# Patient Record
Sex: Female | Born: 1975 | Race: Black or African American | Hispanic: No | Marital: Married | Smoking: Former smoker
Health system: Southern US, Community
[De-identification: ages and names within clinical notes are randomized; demographics above are authoritative.]

## PROBLEM LIST (undated history)

## (undated) DIAGNOSIS — G479 Sleep disorder, unspecified: Secondary | ICD-10-CM

## (undated) DIAGNOSIS — K219 Gastro-esophageal reflux disease without esophagitis: Secondary | ICD-10-CM

## (undated) DIAGNOSIS — C50919 Malignant neoplasm of unspecified site of unspecified female breast: Secondary | ICD-10-CM

## (undated) DIAGNOSIS — F32A Depression, unspecified: Secondary | ICD-10-CM

## (undated) DIAGNOSIS — F329 Major depressive disorder, single episode, unspecified: Secondary | ICD-10-CM

## (undated) DIAGNOSIS — D649 Anemia, unspecified: Secondary | ICD-10-CM

## (undated) DIAGNOSIS — F419 Anxiety disorder, unspecified: Secondary | ICD-10-CM

## (undated) HISTORY — DX: Gastro-esophageal reflux disease without esophagitis: K21.9

## (undated) HISTORY — DX: Anxiety disorder, unspecified: F41.9

## (undated) HISTORY — PX: CHOLECYSTECTOMY: SHX55

## (undated) HISTORY — PX: TUBAL LIGATION: SHX77

## (undated) HISTORY — DX: Malignant neoplasm of unspecified site of unspecified female breast: C50.919

## (undated) HISTORY — DX: Anemia, unspecified: D64.9

## (undated) HISTORY — DX: Depression, unspecified: F32.A

## (undated) HISTORY — DX: Sleep disorder, unspecified: G47.9

---
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
## 2008-01-09 HISTORY — PX: BILATERAL TOTAL MASTECTOMY WITH AXILLARY LYMPH NODE DISSECTION: SHX6364

## 2008-01-09 HISTORY — PX: MASTECTOMY: SHX3

## 2017-09-16 ENCOUNTER — Encounter: Payer: Self-pay | Admitting: Emergency Medicine

## 2017-09-16 ENCOUNTER — Emergency Department: Payer: Self-pay

## 2017-09-16 ENCOUNTER — Other Ambulatory Visit: Payer: Self-pay

## 2017-09-16 ENCOUNTER — Emergency Department
Admission: EM | Admit: 2017-09-16 | Discharge: 2017-09-17 | Disposition: A | Discharge status: Home / Self Care | Payer: Self-pay | Attending: Emergency Medicine | Admitting: Emergency Medicine

## 2017-09-16 DIAGNOSIS — R112 Nausea with vomiting, unspecified: Secondary | ICD-10-CM | POA: Insufficient documentation

## 2017-09-16 DIAGNOSIS — Z87891 Personal history of nicotine dependence: Secondary | ICD-10-CM | POA: Insufficient documentation

## 2017-09-16 DIAGNOSIS — E876 Hypokalemia: Secondary | ICD-10-CM | POA: Insufficient documentation

## 2017-09-16 DIAGNOSIS — R1084 Generalized abdominal pain: Secondary | ICD-10-CM | POA: Insufficient documentation

## 2017-09-16 DIAGNOSIS — R110 Nausea: Secondary | ICD-10-CM

## 2017-09-16 LAB — COMPREHENSIVE METABOLIC PANEL
ALT: 12 U/L (ref 0–44)
AST: 17 U/L (ref 15–41)
Albumin: 4 g/dL (ref 3.5–5.0)
Alkaline Phosphatase: 49 U/L (ref 38–126)
Anion gap: 8 (ref 5–15)
BUN: 13 mg/dL (ref 6–20)
CHLORIDE: 107 mmol/L (ref 98–111)
CO2: 22 mmol/L (ref 22–32)
CREATININE: 0.66 mg/dL (ref 0.44–1.00)
Calcium: 8.7 mg/dL — ABNORMAL LOW (ref 8.9–10.3)
Glucose, Bld: 84 mg/dL (ref 70–99)
POTASSIUM: 3 mmol/L — AB (ref 3.5–5.1)
Sodium: 137 mmol/L (ref 135–145)
Total Bilirubin: 0.8 mg/dL (ref 0.3–1.2)
Total Protein: 7.1 g/dL (ref 6.5–8.1)

## 2017-09-16 LAB — URINALYSIS, COMPLETE (UACMP) WITH MICROSCOPIC
BILIRUBIN URINE: NEGATIVE
Bacteria, UA: NONE SEEN
GLUCOSE, UA: NEGATIVE mg/dL
Hgb urine dipstick: NEGATIVE
KETONES UR: 80 mg/dL — AB
LEUKOCYTES UA: NEGATIVE
Nitrite: NEGATIVE
PH: 5 (ref 5.0–8.0)
Protein, ur: NEGATIVE mg/dL
Specific Gravity, Urine: 1.026 (ref 1.005–1.030)

## 2017-09-16 LAB — CBC
HCT: 30.9 % — ABNORMAL LOW (ref 35.0–47.0)
Hemoglobin: 10.3 g/dL — ABNORMAL LOW (ref 12.0–16.0)
MCH: 27.7 pg (ref 26.0–34.0)
MCHC: 33.2 g/dL (ref 32.0–36.0)
MCV: 83.3 fL (ref 80.0–100.0)
Platelets: 205 10*3/uL (ref 150–440)
RBC: 3.71 MIL/uL — AB (ref 3.80–5.20)
RDW: 25.7 % — ABNORMAL HIGH (ref 11.5–14.5)
WBC: 6.8 10*3/uL (ref 3.6–11.0)

## 2017-09-16 LAB — POCT PREGNANCY, URINE: Preg Test, Ur: NEGATIVE

## 2017-09-16 LAB — LIPASE, BLOOD: Lipase: 21 U/L (ref 11–51)

## 2017-09-16 MED ORDER — IOPAMIDOL (ISOVUE-300) INJECTION 61%
100.0000 mL | Freq: Once | INTRAVENOUS | Status: AC | PRN
  Administered 2017-09-16: 100 mL via INTRAVENOUS

## 2017-09-16 MED ORDER — PROMETHAZINE HCL 25 MG/ML IJ SOLN
12.5000 mg | Freq: Once | INTRAMUSCULAR | Status: AC
  Administered 2017-09-16: 12.5 mg via INTRAVENOUS
  Filled 2017-09-16: qty 1

## 2017-09-16 MED ORDER — SODIUM CHLORIDE 0.9 % IV BOLUS
1000.0000 mL | Freq: Once | INTRAVENOUS | Status: AC
  Administered 2017-09-16: 1000 mL via INTRAVENOUS

## 2017-09-16 MED ORDER — POTASSIUM CHLORIDE CRYS ER 20 MEQ PO TBCR
40.0000 meq | EXTENDED_RELEASE_TABLET | Freq: Once | ORAL | Status: AC
  Administered 2017-09-16: 40 meq via ORAL
  Filled 2017-09-16: qty 2

## 2017-09-16 MED ORDER — HYDROMORPHONE HCL 1 MG/ML IJ SOLN
0.5000 mg | Freq: Once | INTRAMUSCULAR | Status: AC
  Administered 2017-09-16: 0.5 mg via INTRAVENOUS
  Filled 2017-09-16: qty 1

## 2017-09-16 NOTE — ED Provider Notes (Addendum)
[facility] Emergency Department Provider Note  ____________________________________________  Time seen: Approximately 10:37 PM  I have reviewed the triage vital signs and the nursing notes.   HISTORY  Chief Complaint No chief complaint on file.    HPI Audrey Rowe is a 42 y.o. female status post remote cholecystectomy, bilateral mastectomy for breast cancer, and C-section presenting for abdominal pain.  The patient reports that over the course of day she has been having a progressively worsening abdominal pain which is diffuse in the front of the abdomen and radiates around to her back.  She has had associated nausea without vomiting.  No diarrhea or constipation.  No dysuria or change in vaginal discharge.  She denies any fevers, chills, lightheadedness or syncope.   History reviewed. No pertinent past medical history.  There are no active problems to display for this patient.   Past Surgical History:  Procedure Laterality Date  . CESAREAN SECTION    . CHOLECYSTECTOMY    . MASTECTOMY  2010      Allergies Tramadol  History reviewed. No pertinent family history.  Social History Social History   Tobacco Use  . Smoking status: Former Smoker    Last attempt to quit: 09/09/2006    Years since quitting: 11.0  Substance Use Topics  . Alcohol use: Yes    Comment: occasionally   . Drug use: Not on file    Review of Systems Constitutional: No fever/chills.  No lightheadedness or syncope. Eyes: No visual changes. ENT: No sore throat. No congestion or rhinorrhea. Cardiovascular: Denies chest pain. Denies palpitations. Respiratory: Denies shortness of breath.  No cough. Gastrointestinal: Diffuse nonfocal abdominal pain.  Positive nausea, no vomiting.  No diarrhea.  Positive constipation. Genitourinary: Negative for dysuria.  No urinary frequency.  No change in vaginal discharge. Musculoskeletal: Positive for abdominal pain that radiates to  the back bilaterally.. Skin: Negative for rash. Neurological: Negative for headaches. No focal numbness, tingling or weakness.     ____________________________________________   PHYSICAL EXAM:  VITAL SIGNS: ED Triage Vitals  Enc Vitals Group     BP 09/16/17 2035 (!) 165/90     Pulse Rate 09/16/17 2035 70     Resp 09/16/17 2035 18     Temp 09/16/17 2035 98.4 F (36.9 C)     Temp Source 09/16/17 2035 Oral     SpO2 09/16/17 2035 100 %     Weight 09/16/17 2037 146 lb (66.2 kg)     Height 09/16/17 2037 5' (1.524 m)     Head Circumference --      Peak Flow --      Pain Score 09/16/17 2048 7     Pain Loc --      Pain Edu? --      Excl. in [place]? --     Constitutional: Alert and oriented.  Answers questions appropriately.  Uncomfortable appearing. Eyes: Conjunctivae are normal.  EOMI. No scleral icterus. Head: Atraumatic. Nose: No congestion/rhinnorhea. Mouth/Throat: Mucous membranes are moist.  Neck: No stridor.  Supple.   Cardiovascular: Normal rate, regular rhythm. No murmurs, rubs or gallops.  Respiratory: Normal respiratory effort.  No accessory muscle use or retractions. Lungs CTAB.  No wheezes, rales or ronchi. Gastrointestinal: Soft, and nondistended.  Diffuse tenderness to palpation in the left upper quadrant, and greater in the lower quadrants bilaterally without focality.  No guarding or rebound.  No peritoneal signs. Musculoskeletal: No LE edema. Neurologic:  A&Ox3.  Speech is clear.  Face and smile  are symmetric.  EOMI.  Moves all extremities well. Skin:  Skin is warm, dry and intact. No rash noted. Psychiatric: Mood and affect are normal. Speech and behavior are normal.  Normal judgement.  ____________________________________________   LABS (all labs ordered are listed, but only abnormal results are displayed)  Labs Reviewed  COMPREHENSIVE METABOLIC PANEL - Abnormal; Notable for the following components:      Result Value   Potassium 3.0 (*)    Calcium 8.7 (*)     All other components within normal limits  CBC - Abnormal; Notable for the following components:   RBC 3.71 (*)    Hemoglobin 10.3 (*)    HCT 30.9 (*)    RDW 25.7 (*)    All other components within normal limits  URINALYSIS, COMPLETE (UACMP) WITH MICROSCOPIC - Abnormal; Notable for the following components:   Color, Urine YELLOW (*)    APPearance HAZY (*)    Ketones, ur 80 (*)    All other components within normal limits  LIPASE, BLOOD  POC URINE PREG, ED  POCT PREGNANCY, URINE   ____________________________________________  EKG  ED ECG REPORT I, Anne-Caroline Mariea Clonts, the attending physician, personally viewed and interpreted this ECG.   Date: 09/17/2017  EKG Time: 2042  Rate: 70  Rhythm: normal sinus rhythm,  Axis: leftward  Intervals:none  ST&T Change: No STEMI  ____________________________________________  RADIOLOGY  Ct Abdomen Pelvis W Contrast  Result Date: 09/17/2017 CLINICAL DATA:  42 y/o F; 1 week of abdominal pain. Nausea without vomiting. EXAM: CT ABDOMEN AND PELVIS WITH CONTRAST TECHNIQUE: Multidetector CT imaging of the abdomen and pelvis was performed using the standard protocol following bolus administration of intravenous contrast. CONTRAST:  122mL ISOVUE-300 IOPAMIDOL (ISOVUE-300) INJECTION 61% COMPARISON:  None. FINDINGS: Lower chest: Bilateral breast implants partially visualized. Hepatobiliary: No focal liver abnormality is seen. Status post cholecystectomy. No biliary dilatation. Pancreas: Unremarkable. No pancreatic ductal dilatation or surrounding inflammatory changes. Spleen: Normal in size without focal abnormality. Adrenals/Urinary Tract: Adrenal glands are unremarkable. Punctate left kidney interpolar nonobstructing stone. Kidneys are otherwise normal, without additional renal calculi, focal lesion, or hydronephrosis. Bladder is unremarkable. Stomach/Bowel: Stomach is within normal limits. Appendix appears normal. No evidence of bowel wall thickening,  distention, or inflammatory changes. Vascular/Lymphatic: No significant vascular findings are present. No enlarged abdominal or pelvic lymph nodes. Reproductive: Uterus and bilateral adnexa are unremarkable. Bilateral tubal ligation. Other: No abdominal wall hernia or abnormality. No abdominopelvic ascites. Musculoskeletal: No acute or significant osseous findings. IMPRESSION: 1. No acute process identified. 2. Punctate left kidney interpolar nonobstructing stone. Electronically Signed   By: Kristine Garbe M.D.   On: 09/17/2017 00:20    ____________________________________________   PROCEDURES  Procedure(s) performed: None  Procedures  Critical Care performed: No ____________________________________________   INITIAL IMPRESSION / ASSESSMENT AND PLAN / ED COURSE  Pertinent labs & imaging results that were available during my care of the patient were reviewed by me and considered in my medical decision making (see chart for details).  42 y.o. female with a remote history of cholecystectomy and C-section presenting for diffuse abdominal pain with nausea but no vomiting, positive constipation.  Overall, the patient with is uncomfortable but has stable vital signs.  Her laboratory studies show a hypokalemia which has been supplemented, but otherwise reassuring laboratory studies including normal white blood cell count.  She has a mild anemia and I have reviewed her chart we have no previous studies for comparison.  We will get a CT scan of the abdomen,  for further evaluation.    ----------------------------------------- 12:40 AM on 09/17/2017 -----------------------------------------  Patient CT scan does not show any acute intra-abdominal process and I will plan to discharge her home with close PMD follow-up.  I will give her a prescription for stool softener and instructions about a high-fiber diet.  She understands follow-up instructions as well as return  precautions.  ____________________________________________  FINAL CLINICAL IMPRESSION(S) / ED DIAGNOSES  Final diagnoses:  Hypokalemia  Abdominal pain, generalized  Nausea without vomiting         NEW MEDICATIONS STARTED DURING THIS VISIT:  New Prescriptions   No medications on file      Eula Listen, MD 09/17/17 0024    Eula Listen, MD 09/17/17 [id number]

## 2017-09-16 NOTE — ED Notes (Signed)
Pt informed of needing urine sample  

## 2017-09-16 NOTE — ED Triage Notes (Signed)
Patient states she has had abd pain x 1 week and has not had a good bowel movement in a couple days. Lots of nausea but no vomiting. Patient states she has not been able to eat like normal.

## 2017-09-16 NOTE — ED Notes (Signed)
Report given to Kala RN. 

## 2017-09-17 MED ORDER — SENNOSIDES-DOCUSATE SODIUM 8.6-50 MG PO TABS: 2.0000 | ORAL_TABLET | Freq: Every day | ORAL | 0 refills | Status: AC | PRN

## 2017-09-17 NOTE — Discharge Instructions (Signed)
Please drink plenty of fluid and eat a high-fiber diet.  You may take Tylenol or Motrin for your pain.  You may take a stool softener to prevent constipation.  Return to the emergency department if you develop severe pain, lightheadedness or fainting, inability to keep down fluids, or any other symptoms concerning to you.

## 2018-01-16 ENCOUNTER — Other Ambulatory Visit: Payer: Self-pay

## 2018-01-16 ENCOUNTER — Encounter: Payer: Self-pay | Admitting: Emergency Medicine

## 2018-01-16 ENCOUNTER — Emergency Department
Admission: EM | Admit: 2018-01-16 | Discharge: 2018-01-16 | Disposition: A | Discharge status: Home / Self Care | Payer: Self-pay | Attending: Emergency Medicine | Admitting: Emergency Medicine

## 2018-01-16 ENCOUNTER — Emergency Department: Payer: Self-pay

## 2018-01-16 DIAGNOSIS — R0602 Shortness of breath: Secondary | ICD-10-CM | POA: Insufficient documentation

## 2018-01-16 DIAGNOSIS — Z853 Personal history of malignant neoplasm of breast: Secondary | ICD-10-CM | POA: Insufficient documentation

## 2018-01-16 DIAGNOSIS — R202 Paresthesia of skin: Secondary | ICD-10-CM | POA: Insufficient documentation

## 2018-01-16 DIAGNOSIS — R110 Nausea: Secondary | ICD-10-CM | POA: Insufficient documentation

## 2018-01-16 DIAGNOSIS — Z87891 Personal history of nicotine dependence: Secondary | ICD-10-CM | POA: Insufficient documentation

## 2018-01-16 DIAGNOSIS — R079 Chest pain, unspecified: Secondary | ICD-10-CM | POA: Insufficient documentation

## 2018-01-16 HISTORY — DX: Malignant neoplasm of unspecified site of unspecified female breast: C50.919

## 2018-01-16 LAB — TROPONIN I: Troponin I: <0.03 ng/mL (ref <0.03)

## 2018-01-16 LAB — CBC
HEMATOCRIT: 36.1 % (ref 36.0–46.0)
HEMOGLOBIN: 11.7 g/dL — AB (ref 12.0–15.0)
MCH: 27 pg (ref 26.0–34.0)
MCHC: 32.4 g/dL (ref 30.0–36.0)
MCV: 83.4 fL (ref 80.0–100.0)
Platelets: 296 10*3/uL (ref 150–400)
RBC: 4.33 MIL/uL (ref 3.87–5.11)
RDW: 19.4 % — ABNORMAL HIGH (ref 11.5–15.5)
WBC: 6.7 10*3/uL (ref 4.0–10.5)
nRBC: 0 % (ref 0.0–0.2)

## 2018-01-16 LAB — BASIC METABOLIC PANEL
Anion gap: 11 (ref 5–15)
BUN: 8 mg/dL (ref 6–20)
CHLORIDE: 107 mmol/L (ref 98–111)
CO2: 19 mmol/L — ABNORMAL LOW (ref 22–32)
Calcium: 9.9 mg/dL (ref 8.9–10.3)
Creatinine, Ser: 0.78 mg/dL (ref 0.44–1.00)
GFR calc Af Amer: >60 mL/min (ref >60)
GFR calc non Af Amer: >60 mL/min (ref >60)
GLUCOSE: 100 mg/dL — AB (ref 70–99)
POTASSIUM: 2.9 mmol/L — AB (ref 3.5–5.1)
Sodium: 137 mmol/L (ref 135–145)

## 2018-01-16 LAB — FIBRIN DERIVATIVES D-DIMER (ARMC ONLY): FIBRIN DERIVATIVES D-DIMER (ARMC): 169.67 ng{FEU}/mL (ref 0.00–499.00)

## 2018-01-16 MED ORDER — POTASSIUM CHLORIDE CRYS ER 20 MEQ PO TBCR
40.0000 meq | EXTENDED_RELEASE_TABLET | Freq: Once | ORAL | Status: AC
  Administered 2018-01-16: 40 meq via ORAL
  Filled 2018-01-16: qty 2

## 2018-01-16 MED ORDER — DIAZEPAM 5 MG PO TABS: 5.0000 mg | ORAL_TABLET | Freq: Three times a day (TID) | ORAL | 0 refills | Status: DC | PRN

## 2018-01-16 MED ORDER — DIAZEPAM 2 MG PO TABS
5.0000 mg | ORAL_TABLET | Freq: Once | ORAL | Status: AC
  Administered 2018-01-16: 5 mg via ORAL
  Filled 2018-01-16: qty 3

## 2018-01-16 NOTE — ED Triage Notes (Addendum)
Pt presents to ED from home via EMS with sudden onset of chest pressure and sob while sitting at home. Pt reports hx of the same but symptoms had resolved on its own at that time. EMS report pt was restless and diaphoretic upon their arrival. Skin warm and dry at this time. Pt appears less anxious at this time. Pt currently has no increased work of breathing noted at this time. +nausea +tingling in her extremities.

## 2018-01-16 NOTE — ED Provider Notes (Signed)
[facility] Emergency Department Provider Note       Time seen: ----------------------------------------- 7:13 AM on 01/16/2018 -----------------------------------------   I have reviewed the triage vital signs and the nursing notes.  HISTORY   Chief Complaint Chest Pain   HPI Audrey Rowe is a 43 y.o. female with a history of breast Cancer who presents to the ED for sudden onset chest pressure and shortness of breath while sitting at home.  Patient reports a history of same.  EMS reports she was restless and diaphoretic on their arrival.  On arrival her symptoms had improved, she did have nausea and tingling in her extremities.  Past Medical History:  Diagnosis Date  . Breast CA ([place])     There are no active problems to display for this patient.   Past Surgical History:  Procedure Laterality Date  . CESAREAN SECTION    . CHOLECYSTECTOMY    . MASTECTOMY  2010    Allergies Tramadol  Social History Social History   Tobacco Use  . Smoking status: Former Smoker    Last attempt to quit: 09/09/2006    Years since quitting: 11.3  . Smokeless tobacco: Never Used  Substance Use Topics  . Alcohol use: Yes    Comment: occasionally   . Drug use: Not Currently   Review of Systems Constitutional: Negative for fever. Cardiovascular: Positive for chest pressure Respiratory: Positive for shortness of breath Gastrointestinal: Negative for abdominal pain, vomiting and diarrhea. Musculoskeletal: Negative for back pain. Skin: Negative for rash. Neurological: Negative for headaches, focal weakness or numbness.  All systems negative/normal/unremarkable except as stated in the HPI  ____________________________________________   PHYSICAL EXAM:  VITAL SIGNS: ED Triage Vitals  Enc Vitals Group     BP 01/16/18 0643 (!) 144/89     Pulse Rate 01/16/18 0643 83     Resp 01/16/18 0643 18     Temp 01/16/18 0643 97.9 F (36.6 C)     Temp Source  01/16/18 0643 Oral     SpO2 01/16/18 0637 100 %     Weight 01/16/18 0644 140 lb (63.5 kg)     Height 01/16/18 0644 5' (1.524 m)     Head Circumference --      Peak Flow --      Pain Score 01/16/18 0644 5     Pain Loc --      Pain Edu? --      Excl. in [place]? --     Constitutional: Alert and oriented. Well appearing and in no distress. Eyes: Conjunctivae are normal. Normal extraocular movements. ENT      Head: Normocephalic and atraumatic.      Nose: No congestion/rhinnorhea.      Mouth/Throat: Mucous membranes are moist.      Neck: No stridor. Cardiovascular: Normal rate, regular rhythm. No murmurs, rubs, or gallops. Respiratory: Normal respiratory effort without tachypnea nor retractions. Breath sounds are clear and equal bilaterally. No wheezes/rales/rhonchi. Gastrointestinal: Soft and nontender. Normal bowel sounds Musculoskeletal: Nontender with normal range of motion in extremities. No lower extremity tenderness nor edema. Neurologic:  Normal speech and language. No gross focal neurologic deficits are appreciated.  Skin:  Skin is warm, dry and intact. No rash noted. Psychiatric: Mood and affect are normal. Speech and behavior are normal.  ____________________________________________  EKG: Interpreted by me.  Sinus rhythm rate 74 bpm, likely anterior infarct age-indeterminate, normal axis, normal QT  ____________________________________________  ED COURSE:  As part of my medical decision making, I reviewed  the following data within the [place] History obtained from family if available, nursing notes, old chart and ekg, as well as notes from prior ED visits. Patient presented for chest pressure and shortness of breath, we will assess with labs and imaging as indicated at this time.   Procedures ____________________________________________   LABS (pertinent positives/negatives)  Labs Reviewed  BASIC METABOLIC PANEL - Abnormal; Notable for the following  components:      Result Value   Potassium 2.9 (*)    CO2 19 (*)    Glucose, Bld 100 (*)    All other components within normal limits  CBC - Abnormal; Notable for the following components:   Hemoglobin 11.7 (*)    RDW 19.4 (*)    All other components within normal limits  TROPONIN I  FIBRIN DERIVATIVES D-DIMER ([facility] ONLY)  POC URINE PREG, ED    RADIOLOGY  Chest x-ray  IMPRESSION: No acute cardiopulmonary disease ____________________________________________   DIFFERENTIAL DIAGNOSIS   Unstable angina, MI, PE, pneumothorax, anxiety, GERD  FINAL ASSESSMENT AND PLAN  Chest pain   Plan: The patient had presented for chest pressure and shortness of breath. Patient's labs did not reveal any acute process including negative d-dimer. Patient's imaging is reassuring.  Unclear etiology for her symptoms.  I will prescribe anxiolytics but refer her for cardiology outpatient work-up.   Laurence Aly, MD    Note: This note was generated in part or whole with voice recognition software. Voice recognition is usually quite accurate but there are transcription errors that can and very often do occur. I apologize for any typographical errors that were not detected and corrected.     Earleen Newport, MD 01/16/18 [phone]

## 2018-05-23 ENCOUNTER — Emergency Department
Admission: EM | Admit: 2018-05-23 | Discharge: 2018-05-23 | Disposition: A | Discharge status: Home / Self Care | Payer: Medicaid Other | Attending: Emergency Medicine | Admitting: Emergency Medicine

## 2018-05-23 ENCOUNTER — Emergency Department: Payer: Medicaid Other

## 2018-05-23 ENCOUNTER — Other Ambulatory Visit: Payer: Self-pay

## 2018-05-23 ENCOUNTER — Encounter: Payer: Self-pay | Admitting: Emergency Medicine

## 2018-05-23 DIAGNOSIS — Z853 Personal history of malignant neoplasm of breast: Secondary | ICD-10-CM | POA: Insufficient documentation

## 2018-05-23 DIAGNOSIS — Z79899 Other long term (current) drug therapy: Secondary | ICD-10-CM | POA: Diagnosis not present

## 2018-05-23 DIAGNOSIS — R079 Chest pain, unspecified: Secondary | ICD-10-CM | POA: Diagnosis present

## 2018-05-23 DIAGNOSIS — Z9010 Acquired absence of unspecified breast and nipple: Secondary | ICD-10-CM | POA: Diagnosis not present

## 2018-05-23 DIAGNOSIS — Z87891 Personal history of nicotine dependence: Secondary | ICD-10-CM | POA: Diagnosis not present

## 2018-05-23 LAB — CBC
HCT: 29.7 % — ABNORMAL LOW (ref 36.0–46.0)
Hemoglobin: 9.4 g/dL — ABNORMAL LOW (ref 12.0–15.0)
MCH: 26.7 pg (ref 26.0–34.0)
MCHC: 31.6 g/dL (ref 30.0–36.0)
MCV: 84.4 fL (ref 80.0–100.0)
Platelets: 199 10*3/uL (ref 150–400)
RBC: 3.52 MIL/uL — ABNORMAL LOW (ref 3.87–5.11)
RDW: 18.7 % — ABNORMAL HIGH (ref 11.5–15.5)
WBC: 9.1 10*3/uL (ref 4.0–10.5)
nRBC: 0 % (ref 0.0–0.2)

## 2018-05-23 LAB — BASIC METABOLIC PANEL
Anion gap: 9 (ref 5–15)
BUN: 9 mg/dL (ref 6–20)
CO2: 19 mmol/L — ABNORMAL LOW (ref 22–32)
Calcium: 8.4 mg/dL — ABNORMAL LOW (ref 8.9–10.3)
Chloride: 108 mmol/L (ref 98–111)
Creatinine, Ser: 0.76 mg/dL (ref 0.44–1.00)
GFR calc Af Amer: >60 mL/min (ref >60)
GFR calc non Af Amer: >60 mL/min (ref >60)
Glucose, Bld: 91 mg/dL (ref 70–99)
Potassium: 3 mmol/L — ABNORMAL LOW (ref 3.5–5.1)
Sodium: 136 mmol/L (ref 135–145)

## 2018-05-23 LAB — TROPONIN I
Troponin I: <0.03 ng/mL (ref <0.03)
Troponin I: <0.03 ng/mL (ref <0.03)

## 2018-05-23 MED ORDER — SODIUM CHLORIDE 0.9% FLUSH: 3.0000 mL | Freq: Once | INTRAVENOUS | Status: DC

## 2018-05-23 MED ORDER — LIDOCAINE VISCOUS HCL 2 % MT SOLN
15.0000 mL | Freq: Once | OROMUCOSAL | Status: AC
  Administered 2018-05-23: 15 mL via ORAL
  Filled 2018-05-23: qty 15

## 2018-05-23 MED ORDER — FAMOTIDINE 20 MG PO TABS: 20.0000 mg | ORAL_TABLET | Freq: Every day | ORAL | 1 refills | Status: AC

## 2018-05-23 MED ORDER — ALUM & MAG HYDROXIDE-SIMETH 200-200-20 MG/5ML PO SUSP
30.0000 mL | Freq: Once | ORAL | Status: AC
  Administered 2018-05-23: 30 mL via ORAL
  Filled 2018-05-23: qty 30

## 2018-05-23 MED ORDER — POTASSIUM CHLORIDE CRYS ER 20 MEQ PO TBCR
40.0000 meq | EXTENDED_RELEASE_TABLET | Freq: Once | ORAL | Status: AC
  Administered 2018-05-23: 40 meq via ORAL
  Filled 2018-05-23: qty 2

## 2018-05-23 NOTE — ED Provider Notes (Signed)
[facility] Emergency Department Provider Note   ____________________________________________   I have reviewed the triage vital signs and the nursing notes.   HISTORY  Chief Complaint Chest Pain and Numbness   History limited by: Not Limited   HPI Audrey Rowe is a 43 y.o. female who presents to the emergency department today because of concern for chest pain and left arm numbness. The symptoms started around 1 pm today as the patient was trying to nap. She does frequently take naps but did feel slightly more tired today than normal. The pain is located in her center chest and is described as sharp. It is accompanied by left arm numbness and some shortness of breath. She has had similar symptoms in the past but states when she has been worked up they have not been able to find anything. The patient did have an ekg performed roughly 10 years ago which she was told showed signs that she might have had a heart attack.    Records reviewed. Per medical record review patient has a history of breast ca  Past Medical History:  Diagnosis Date  . Breast CA ([place])     There are no active problems to display for this patient.   Past Surgical History:  Procedure Laterality Date  . CESAREAN SECTION    . CHOLECYSTECTOMY    . MASTECTOMY  2010    Prior to Admission medications   Medication Sig Start Date End Date Taking? Authorizing Provider  diazepam (VALIUM) 5 MG tablet Take 1 tablet (5 mg total) by mouth every 8 (eight) hours as needed for anxiety. 01/16/18   Earleen Newport, MD  senna-docusate (SENOKOT-S) 8.6-50 MG tablet Take 2 tablets by mouth daily as needed for mild constipation. 09/17/17 09/17/18  Eula Listen, MD    Allergies Tramadol  History reviewed. No pertinent family history.  Social History Social History   Tobacco Use  . Smoking status: Former Smoker    Last attempt to quit: 09/09/2006    Years since quitting: 11.7  .  Smokeless tobacco: Never Used  Substance Use Topics  . Alcohol use: Yes    Comment: fifth 3 x per week  . Drug use: Not Currently    Frequency: 1.0 times per week    Types: Marijuana    Review of Systems Constitutional: No fever/chills Eyes: No visual changes. ENT: No sore throat. Cardiovascular: Positive for chest pain. Respiratory: Positive for shortness of breath. Gastrointestinal: No abdominal pain.  No nausea, no vomiting.  No diarrhea.   Genitourinary: Negative for dysuria. Musculoskeletal: Negative for back pain. Skin: Negative for rash. Neurological: Positive for left arm numbness.  ____________________________________________   PHYSICAL EXAM:  VITAL SIGNS: ED Triage Vitals  Enc Vitals Group     BP 05/23/18 1507 (!) 141/83     Pulse Rate 05/23/18 1507 76     Resp 05/23/18 1507 18     Temp 05/23/18 1507 98.2 F (36.8 C)     Temp Source 05/23/18 1507 Oral     SpO2 05/23/18 1507 98 %     Weight 05/23/18 1509 142 lb (64.4 kg)     Height 05/23/18 1509 4\' 11"  (1.499 m)     Head Circumference --      Peak Flow --      Pain Score 05/23/18 1509 7   Constitutional: Alert and oriented.  Eyes: Conjunctivae are normal.  ENT      Head: Normocephalic and atraumatic.  Nose: No congestion/rhinnorhea.      Mouth/Throat: Mucous membranes are moist.      Neck: No stridor. Hematological/Lymphatic/Immunilogical: No cervical lymphadenopathy. Cardiovascular: Normal rate, regular rhythm.  No murmurs, rubs, or gallops.  Respiratory: Normal respiratory effort without tachypnea nor retractions. Breath sounds are clear and equal bilaterally. No wheezes/rales/rhonchi. Gastrointestinal: Soft and non tender. No rebound. No guarding.  Genitourinary: Deferred Musculoskeletal: Normal range of motion in all extremities. No lower extremity edema. Neurologic:  Normal speech and language. No gross focal neurologic deficits are appreciated.  Skin:  Skin is warm, dry and intact. No rash  noted. Psychiatric: Mood and affect are normal. Speech and behavior are normal. Patient exhibits appropriate insight and judgment.  ____________________________________________    LABS (pertinent positives/negatives)  Trop <0.03 CBC wbc 9.1, hgb 9.4, plt 199 BMP na 136, k 3.0, cr 0.76  ____________________________________________   EKG  I, Nance Pear, attending physician, personally viewed and interpreted this EKG  EKG Time: 1504 Rate: 76 Rhythm: normal sinus rhythm Axis: normal Intervals: qtc 429 QRS: low voltage ST changes: no st elevation Impression: abnormal ekg   ____________________________________________    RADIOLOGY  CXR No edema or consolidation ____________________________________________   PROCEDURES  Procedures  ____________________________________________   INITIAL IMPRESSION / ASSESSMENT AND PLAN / ED COURSE  Pertinent labs & imaging results that were available during my care of the patient were reviewed by me and considered in my medical decision making (see chart for details).   Patient presented to the er today because of concern for chest pain. ddx would be broad including acs, pe, pna, ptx, dissection, esophagitis, costochondritis amongst other etiologies. Troponin negative x 2. ekg without concerning findings. CXR without concerning findings. Patient did feel improvement after gi cocktail. At this time think gerd/esophagitis likely. Discussed this with the patient. Will plan on discharging with antacid and information on dietary changes.   ____________________________________________   FINAL CLINICAL IMPRESSION(S) / ED DIAGNOSES  Final diagnoses:  Nonspecific chest pain     Note: This dictation was prepared with Dragon dictation. Any transcriptional errors that result from this process are unintentional     Nance Pear, MD 05/23/18 2039

## 2018-05-23 NOTE — ED Triage Notes (Signed)
Pt via pov from home with chest pain, numbness in right arm, and headache since awakening from a nap at 1330. 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 No neuro deficits noted during triage. Pt alert & oriented with NAD noted.

## 2018-05-23 NOTE — Discharge Instructions (Addendum)
Please seek medical attention for any high fevers, chest pain, shortness of breath, change in behavior, persistent vomiting, bloody stool or any other new or concerning symptoms.  

## 2018-08-03 ENCOUNTER — Other Ambulatory Visit: Payer: Self-pay

## 2018-08-03 ENCOUNTER — Emergency Department
Admission: EM | Admit: 2018-08-03 | Discharge: 2018-08-03 | Disposition: A | Discharge status: Home / Self Care | Payer: Medicaid Other | Attending: Emergency Medicine | Admitting: Emergency Medicine

## 2018-08-03 ENCOUNTER — Emergency Department: Payer: Medicaid Other

## 2018-08-03 DIAGNOSIS — Z9882 Breast implant status: Secondary | ICD-10-CM | POA: Diagnosis not present

## 2018-08-03 DIAGNOSIS — Z9013 Acquired absence of bilateral breasts and nipples: Secondary | ICD-10-CM | POA: Diagnosis not present

## 2018-08-03 DIAGNOSIS — R1032 Left lower quadrant pain: Secondary | ICD-10-CM | POA: Diagnosis not present

## 2018-08-03 DIAGNOSIS — R109 Unspecified abdominal pain: Secondary | ICD-10-CM

## 2018-08-03 DIAGNOSIS — Z79899 Other long term (current) drug therapy: Secondary | ICD-10-CM | POA: Insufficient documentation

## 2018-08-03 DIAGNOSIS — K2970 Gastritis, unspecified, without bleeding: Secondary | ICD-10-CM | POA: Diagnosis not present

## 2018-08-03 DIAGNOSIS — Z87891 Personal history of nicotine dependence: Secondary | ICD-10-CM | POA: Insufficient documentation

## 2018-08-03 DIAGNOSIS — Z853 Personal history of malignant neoplasm of breast: Secondary | ICD-10-CM | POA: Diagnosis not present

## 2018-08-03 DIAGNOSIS — R1013 Epigastric pain: Secondary | ICD-10-CM | POA: Insufficient documentation

## 2018-08-03 DIAGNOSIS — F1010 Alcohol abuse, uncomplicated: Secondary | ICD-10-CM | POA: Diagnosis not present

## 2018-08-03 DIAGNOSIS — N644 Mastodynia: Secondary | ICD-10-CM | POA: Diagnosis present

## 2018-08-03 LAB — URINALYSIS, COMPLETE (UACMP) WITH MICROSCOPIC
Bacteria, UA: NONE SEEN
Bilirubin Urine: NEGATIVE
Glucose, UA: NEGATIVE mg/dL
Hgb urine dipstick: NEGATIVE
Ketones, ur: NEGATIVE mg/dL
Leukocytes,Ua: NEGATIVE
Nitrite: NEGATIVE
Protein, ur: 30 mg/dL — AB
Specific Gravity, Urine: 1.026 (ref 1.005–1.030)
pH: 5 (ref 5.0–8.0)

## 2018-08-03 LAB — COMPREHENSIVE METABOLIC PANEL
ALT: 10 U/L (ref 0–44)
AST: 16 U/L (ref 15–41)
Albumin: 4.3 g/dL (ref 3.5–5.0)
Alkaline Phosphatase: 47 U/L (ref 38–126)
Anion gap: 10 (ref 5–15)
BUN: 10 mg/dL (ref 6–20)
CO2: 19 mmol/L — ABNORMAL LOW (ref 22–32)
Calcium: 9 mg/dL (ref 8.9–10.3)
Chloride: 108 mmol/L (ref 98–111)
Creatinine, Ser: 0.56 mg/dL (ref 0.44–1.00)
GFR calc Af Amer: >60 mL/min (ref >60)
GFR calc non Af Amer: >60 mL/min (ref >60)
Glucose, Bld: 79 mg/dL (ref 70–99)
Potassium: 3.6 mmol/L (ref 3.5–5.1)
Sodium: 137 mmol/L (ref 135–145)
Total Bilirubin: 0.6 mg/dL (ref 0.3–1.2)
Total Protein: 8.1 g/dL (ref 6.5–8.1)

## 2018-08-03 LAB — CBC
HCT: 32.1 % — ABNORMAL LOW (ref 36.0–46.0)
Hemoglobin: 10.1 g/dL — ABNORMAL LOW (ref 12.0–15.0)
MCH: 25.4 pg — ABNORMAL LOW (ref 26.0–34.0)
MCHC: 31.5 g/dL (ref 30.0–36.0)
MCV: 80.7 fL (ref 80.0–100.0)
Platelets: 366 10*3/uL (ref 150–400)
RBC: 3.98 MIL/uL (ref 3.87–5.11)
RDW: 18.3 % — ABNORMAL HIGH (ref 11.5–15.5)
WBC: 6.3 10*3/uL (ref 4.0–10.5)
nRBC: 0 % (ref 0.0–0.2)

## 2018-08-03 LAB — MAGNESIUM: Magnesium: 1.9 mg/dL (ref 1.7–2.4)

## 2018-08-03 LAB — LIPASE, BLOOD: Lipase: 21 U/L (ref 11–51)

## 2018-08-03 LAB — ETHANOL: Alcohol, Ethyl (B): <10 mg/dL (ref <10)

## 2018-08-03 LAB — POCT PREGNANCY, URINE: Preg Test, Ur: NEGATIVE

## 2018-08-03 MED ORDER — FENTANYL CITRATE (PF) 100 MCG/2ML IJ SOLN
50.0000 ug | Freq: Once | INTRAMUSCULAR | Status: AC
  Administered 2018-08-03: 10:00:00 50 ug via INTRAVENOUS
  Filled 2018-08-03: qty 2

## 2018-08-03 MED ORDER — ONDANSETRON HCL 4 MG/2ML IJ SOLN
4.0000 mg | Freq: Once | INTRAMUSCULAR | Status: AC
  Administered 2018-08-03: 4 mg via INTRAVENOUS
  Filled 2018-08-03: qty 2

## 2018-08-03 MED ORDER — IOPAMIDOL (ISOVUE-300) INJECTION 61%
30.0000 mL | Freq: Once | INTRAVENOUS | Status: AC | PRN
  Administered 2018-08-03: 09:00:00 30 mL via ORAL

## 2018-08-03 MED ORDER — ALUM & MAG HYDROXIDE-SIMETH 200-200-20 MG/5ML PO SUSP
30.0000 mL | Freq: Once | ORAL | Status: AC
  Administered 2018-08-03: 09:00:00 30 mL via ORAL
  Filled 2018-08-03: qty 30

## 2018-08-03 MED ORDER — PANTOPRAZOLE SODIUM 40 MG PO TBEC: 40.0000 mg | DELAYED_RELEASE_TABLET | Freq: Every day | ORAL | 1 refills | Status: DC

## 2018-08-03 MED ORDER — SODIUM CHLORIDE 0.9 % IV BOLUS
1000.0000 mL | Freq: Once | INTRAVENOUS | Status: AC
  Administered 2018-08-03: 1000 mL via INTRAVENOUS

## 2018-08-03 MED ORDER — IOPAMIDOL (ISOVUE-300) INJECTION 61%
100.0000 mL | Freq: Once | INTRAVENOUS | Status: AC | PRN
  Administered 2018-08-03: 11:00:00 100 mL via INTRAVENOUS

## 2018-08-03 MED ORDER — IOHEXOL 300 MG/ML  SOLN: 50.0000 mL | Freq: Once | INTRAMUSCULAR | Status: DC | PRN

## 2018-08-03 NOTE — Discharge Instructions (Addendum)
Return to the emergency room for any new or worsening symptoms including increased pain, persistent vomiting bleeding black stool or you feel worse in any way, follow closely with primary care doctor and the GI doctor listed above, we strongly advise you gradually cut down on your drinking.  If you do wish to seek help for your drinking, please call the number above for [facility]

## 2018-08-03 NOTE — ED Notes (Signed)
Pt verbalized understanding of discharge instructions. NAD at this time. 

## 2018-08-03 NOTE — ED Provider Notes (Signed)
[facility] Emergency Department Provider Note  ____________________________________________   I have reviewed the triage vital signs and the nursing notes. Where available I have reviewed prior notes and, if possible and indicated, outside hospital notes.   Patient seen and evaluated during the coronavirus epidemic during a time with low staffing  Patient seen for the symptoms described in the history of present illness. She was evaluated in the context of the global COVID-19 pandemic, which necessitated consideration that the patient might be at risk for infection with the SARS-CoV-2 virus that causes COVID-19. Institutional protocols and algorithms that pertain to the evaluation of patients at risk for COVID-19 are in a state of rapid change based on information released by regulatory bodies including the CDC and federal and state organizations. These policies and algorithms were followed during the patient's care in the ED.    HISTORY  Chief Complaint Problem with Breast Implants and Abdominal Pain    HPI Audrey Rowe is a 43 y.o. female with a past history of breast cancer about 5 years ago, in remission, did have bilateral mastectomies.  The cancer was in the left breast.  She states that she did undergo full treatment and believes herself to be cancer free.  Patient had breast implants done at that time.  She feels that over the last month they become somewhat tender and they are "shifting" they feel to be more lateral than they were when they were initially put in.  There is been no redness there is been no swelling this been no fever, but they are more tender than they normally are and she wants her breast implants to be evaluated.  Patient also complains of 3 days of mostly epigastric although to some extent left-sided abdominal discomfort.  She has had nausea.  She vomited x1.  No fever no chills.  No hematemesis.  No melena.  Normal bowel movements.   Slight constipation.  Patient does state that she drinks alcohol.  She states 3 times a week she will have 1/5 of hard liquor.  This seems to make her stomach pain worse but sometimes she drinks like that in order to numb the pain.  He has some food related discomfort with this as well when she eats some food it makes her epigastric pain worse.  Patient is status post cholecystectomy in the past..     Past Medical History:  Diagnosis Date  . Breast CA ([place])     There are no active problems to display for this patient.   Past Surgical History:  Procedure Laterality Date  . CESAREAN SECTION    . CHOLECYSTECTOMY    . MASTECTOMY  2010    Prior to Admission medications   Medication Sig Start Date End Date Taking? Authorizing Provider  diazepam (VALIUM) 5 MG tablet Take 1 tablet (5 mg total) by mouth every 8 (eight) hours as needed for anxiety. 01/16/18   Earleen Newport, MD  famotidine (PEPCID) 20 MG tablet Take 1 tablet (20 mg total) by mouth daily. 05/23/18 05/23/19  Nance Pear, MD  senna-docusate (SENOKOT-S) 8.6-50 MG tablet Take 2 tablets by mouth daily as needed for mild constipation. 09/17/17 09/17/18  Eula Listen, MD    Allergies Tramadol  No family history on file.  Social History Social History   Tobacco Use  . Smoking status: Former Smoker    Quit date: 09/09/2006    Years since quitting: 11.9  . Smokeless tobacco: Never Used  Substance Use Topics  .  Alcohol use: Yes    Comment: fifth 3 x per week  . Drug use: Not Currently    Frequency: 1.0 times per week    Types: Marijuana    Review of Systems Constitutional: No fever/chills Eyes: No visual changes. ENT: No sore throat. No stiff neck no neck pain Cardiovascular: Denies chest pain. Respiratory: Denies shortness of breath. Gastrointestinal:   Nausea.  No diarrhea.  No constipation. Genitourinary: Negative for dysuria. Musculoskeletal: Negative lower extremity swelling Skin: Negative for  rash. Neurological: Negative for severe headaches, focal weakness or numbness.   ____________________________________________   PHYSICAL EXAM:  VITAL SIGNS: ED Triage Vitals [08/03/18 0659]  Enc Vitals Group     BP      Pulse      Resp      Temp      Temp src      SpO2      Weight 135 lb (61.2 kg)     Height 5' (1.524 m)     Head Circumference      Peak Flow      Pain Score 7     Pain Loc      Pain Edu?      Excl. in [place]?     Constitutional: Alert and oriented. Well appearing and in no acute distress. Eyes: Conjunctivae are normal Head: Atraumatic HEENT: No congestion/rhinnorhea. Mucous membranes are moist.  Oropharynx non-erythematous Neck:   Nontender with no meningismus, no masses, no stridor Chest, Levada Dy, nurse, present for exam as chaperone.  Breast implants appear to be in good position to me they are slightly tender but there is no evidence of redness or abscess or significant disruption or movement. Cardiovascular: Normal rate, regular rhythm. Grossly normal heart sounds.  Good peripheral circulation. Respiratory: Normal respiratory effort.  No retractions. Lungs CTAB. Abdominal: Soft and has an epigastric pain and tenderness, positive left-sided discomfort as well.  Voluntary guarding with no rebound nonsurgical abdomen.. No distention.  Back:  There is no focal tenderness or step off.  there is no midline tenderness there are no lesions noted. there is no CVA tenderness  Musculoskeletal: No lower extremity tenderness, no upper extremity tenderness. No joint effusions, no DVT signs strong distal pulses no edema Neurologic:  Normal speech and language. No gross focal neurologic deficits are appreciated.  Skin:  Skin is warm, dry and intact. No rash noted. Psychiatric: Mood and affect are normal. Speech and behavior are normal.  ____________________________________________   LABS (all labs ordered are listed, but only abnormal results are displayed)  Labs  Reviewed  COMPREHENSIVE METABOLIC PANEL - Abnormal; Notable for the following components:      Result Value   CO2 19 (*)    All other components within normal limits  CBC - Abnormal; Notable for the following components:   Hemoglobin 10.1 (*)    HCT 32.1 (*)    MCH 25.4 (*)    RDW 18.3 (*)    All other components within normal limits  URINALYSIS, COMPLETE (UACMP) WITH MICROSCOPIC - Abnormal; Notable for the following components:   Color, Urine YELLOW (*)    APPearance HAZY (*)    Protein, ur 30 (*)    All other components within normal limits  LIPASE, BLOOD  ETHANOL  MAGNESIUM  POCT PREGNANCY, URINE  POC URINE PREG, ED    Pertinent labs  results that were available during my care of the patient were reviewed by me and considered in my medical decision making (see chart for  details). ____________________________________________  EKG  I personally interpreted any EKGs ordered by me or triage  ____________________________________________  RADIOLOGY  Pertinent labs & imaging results that were available during my care of the patient were reviewed by me and considered in my medical decision making (see chart for details). If possible, patient and/or family made aware of any abnormal findings.  Dg Chest 2 View  Result Date: 08/03/2018 CLINICAL DATA:  43 year old female with history of pain in bilateral breasts. EXAM: CHEST - 2 VIEW COMPARISON:  Chest x-ray 05/23/2018. FINDINGS: Lung volumes are normal. No consolidative airspace disease. No pleural effusions. No pneumothorax. No pulmonary nodule or mass noted. Pulmonary vasculature and the cardiomediastinal silhouette are within normal limits. Surgical clips projecting over the right upper quadrant of the abdomen, likely from prior cholecystectomy. IMPRESSION: No radiographic evidence of acute cardiopulmonary disease. Electronically Signed   By: Vinnie Langton M.D.   On: 08/03/2018 08:34    ____________________________________________    PROCEDURES  Procedure(s) performed: None  Procedures  Critical Care performed: None  ____________________________________________   INITIAL IMPRESSION / ASSESSMENT AND PLAN / ED COURSE  Pertinent labs & imaging results that were available during my care of the patient were reviewed by me and considered in my medical decision making (see chart for details).   Patient here with 2 complaints, the first is that her breast implants seem off to her somehow.  And a little more uncomfortable than normal.  They look well to me.  No evidence of rupture on exam not red not swollen not otherwise injured in any way that I can determine.  Patient also complains of abdominal pain which seems to be in some ways related to her significant alcohol ingestion.  I have counseled her about alcohol.  Blood work is reassuring.  She does also have left lower quadrant discomfort however diverticulitis is a possibility obtain CT scan to verify no other pathology noted.  Her abdomen is nonsurgical at this time.  If CT scan is reassuring with it is my hope that we give the patient safely home with antiacids and follow-up with GI medicine.  No evidence of GI bleeding.  Blood work is reassuring    ____________________________________________   FINAL CLINICAL IMPRESSION(S) / ED DIAGNOSES  Final diagnoses:  None      This chart was dictated using voice recognition software.  Despite best efforts to proofread,  errors can occur which can change meaning.      Schuyler Amor, MD 08/03/18 [phone]

## 2018-08-03 NOTE — ED Triage Notes (Signed)
Patient reports feels like her breast implants have shifted (right feels like it is towards axilla).  Patient reports abdominal pain with nausea for 4-5 days.

## 2018-08-03 NOTE — ED Notes (Signed)
Pt states bilateral breast pain. Pt states "not really abdominal pain" but also has c/o of lower left side pelvis pain. Pt states has had implants since 2010.

## 2018-08-03 NOTE — ED Notes (Signed)
Patient transported to X-ray 

## 2018-08-03 NOTE — ED Notes (Signed)
Patient transported to CT 

## 2018-08-18 DIAGNOSIS — F418 Other specified anxiety disorders: Secondary | ICD-10-CM | POA: Insufficient documentation

## 2018-08-18 DIAGNOSIS — K219 Gastro-esophageal reflux disease without esophagitis: Secondary | ICD-10-CM | POA: Insufficient documentation

## 2018-08-18 DIAGNOSIS — G4489 Other headache syndrome: Secondary | ICD-10-CM | POA: Insufficient documentation

## 2018-08-18 DIAGNOSIS — R109 Unspecified abdominal pain: Secondary | ICD-10-CM | POA: Insufficient documentation

## 2018-08-22 ENCOUNTER — Encounter: Payer: Self-pay | Admitting: Plastic Surgery

## 2018-08-22 ENCOUNTER — Ambulatory Visit (INDEPENDENT_AMBULATORY_CARE_PROVIDER_SITE_OTHER): Payer: Medicaid Other | Admitting: Plastic Surgery

## 2018-08-22 ENCOUNTER — Other Ambulatory Visit: Payer: Self-pay

## 2018-08-22 DIAGNOSIS — Z9889 Other specified postprocedural states: Secondary | ICD-10-CM

## 2018-08-22 DIAGNOSIS — Z853 Personal history of malignant neoplasm of breast: Secondary | ICD-10-CM | POA: Diagnosis not present

## 2018-08-22 DIAGNOSIS — Z9013 Acquired absence of bilateral breasts and nipples: Secondary | ICD-10-CM

## 2018-08-22 DIAGNOSIS — Z9882 Breast implant status: Secondary | ICD-10-CM | POA: Diagnosis not present

## 2018-08-22 DIAGNOSIS — N644 Mastodynia: Secondary | ICD-10-CM

## 2018-08-22 DIAGNOSIS — T8544XA Capsular contracture of breast implant, initial encounter: Secondary | ICD-10-CM | POA: Insufficient documentation

## 2018-08-22 DIAGNOSIS — Z978 Presence of other specified devices: Secondary | ICD-10-CM

## 2018-08-22 NOTE — Progress Notes (Signed)
Patient ID: Audrey Rowe, female    DOB: 1975/04/15, 43 y.o.   MRN: [id number]   Chief Complaint  Patient presents with  . Advice Only    for implant issues status post mastectomy  . Breast Problem    The patient is a 43 year old here for evaluation of her breasts.  In 2011 she underwent bilateral mastectomies in [place], [place].  She then had bilateral reconstruction.  She thinks she has silicone implants.  She is not sure about the size.  Her preoperative bra size was a 36 A and postoperatively of 36 D.  She is not sure what size implants she has in place.  She is going to try to look for that information.  She has noticed that it may are tender and sore with even light palpation.  She is overall happy with the size.  She looks very good.  It looks like there was recreation of the area [place].  I do not see tattoo.  The patient is interested in having that done at a later time.  There appears to be more contracture on the right compared to the left.  It is tender with palpation.  There is a slight distortion and likely capsular contraction.  She has not had any recent imaging.   Review of Systems  Constitutional: Negative.   HENT: Negative.   Eyes: Negative.   Respiratory: Negative.  Negative for chest tightness and shortness of breath.   Cardiovascular: Negative for leg swelling.  Gastrointestinal: Negative for abdominal pain.  Endocrine: Negative.   Genitourinary: Negative.   Musculoskeletal: Negative.  Negative for back pain.  Neurological: Negative.   Hematological: Negative.   Psychiatric/Behavioral: Negative.     Past Medical History:  Diagnosis Date  . Breast CA [facility])     Past Surgical History:  Procedure Laterality Date  . CESAREAN SECTION    . CHOLECYSTECTOMY    . MASTECTOMY  2010      Current Outpatient Medications:  .  amoxicillin-clavulanate (AUGMENTIN) 875-125 MG tablet, TK 1 T PO BID WF FOR 10 DAYS, Disp: , Rfl:  .  diazepam (VALIUM) 5 MG tablet,  Take 1 tablet (5 mg total) by mouth every 8 (eight) hours as needed for anxiety., Disp: 20 tablet, Rfl: 0 .  famotidine (PEPCID) 20 MG tablet, Take 1 tablet (20 mg total) by mouth daily., Disp: 30 tablet, Rfl: 1 .  pantoprazole (PROTONIX) 40 MG tablet, Take 1 tablet (40 mg total) by mouth daily., Disp: 30 tablet, Rfl: 1 .  senna-docusate (SENOKOT-S) 8.6-50 MG tablet, Take 2 tablets by mouth daily as needed for mild constipation., Disp: 30 tablet, Rfl: 0   Objective:   Vitals:   08/22/18 0900  BP: 130/80  Pulse: 85  Temp: (!) 97.3 F (36.3 C)  SpO2: 100%    Physical Exam Vitals signs and nursing note reviewed.  Constitutional:      Appearance: Normal appearance.  HENT:     Head: Normocephalic and atraumatic.  Eyes:     Extraocular Movements: Extraocular movements intact.     Pupils: Pupils are equal, round, and reactive to light.  Neck:     Musculoskeletal: Normal range of motion.  Cardiovascular:     Rate and Rhythm: Normal rate.     Pulses: Normal pulses.  Pulmonary:     Effort: Pulmonary effort is normal. No respiratory distress.  Abdominal:     General: Abdomen is flat. There is no distension.     Tenderness:  There is no abdominal tenderness.  Musculoskeletal: Normal range of motion.  Neurological:     General: No focal deficit present.     Mental Status: She is alert and oriented to person, place, and time.  Psychiatric:        Mood and Affect: Mood normal.        Behavior: Behavior normal.        Thought Content: Thought content normal.        Judgment: Judgment normal.     Assessment & Plan:     ICD-10-CM   1. History of breast cancer in female  Z1.3   2. History of reconstruction of both breasts  Z98.82   3. History of bilateral mastectomy  Z90.13   4. Breast pain  N64.4   5. Capsular contracture of breast implant, initial encounter  T85.44XA   6. Presence of other specified devices  Z97.8       Recommend MRI for evaluation of her implants.  We will  consider exchange of implants once we have more information.  The patient is going to sign a release of information to get the implant sizes from [place].  Pictures were obtained of the patient and placed in the chart with the patient's or guardian's permission. Patient also did a release of information form to try to obtain her current breast implant size from her surgeon in [place].  [place], DO

## 2018-08-27 ENCOUNTER — Other Ambulatory Visit: Payer: Self-pay | Admitting: Nurse Practitioner

## 2018-08-27 DIAGNOSIS — E559 Vitamin D deficiency, unspecified: Secondary | ICD-10-CM | POA: Insufficient documentation

## 2018-08-27 DIAGNOSIS — R1031 Right lower quadrant pain: Secondary | ICD-10-CM

## 2018-08-27 DIAGNOSIS — N912 Amenorrhea, unspecified: Secondary | ICD-10-CM

## 2018-08-28 ENCOUNTER — Telehealth: Payer: Self-pay

## 2018-08-28 ENCOUNTER — Ambulatory Visit: Payer: Medicaid Other

## 2018-08-28 NOTE — Telephone Encounter (Signed)
Call to pt re: Dr. Eusebio Friendly request for pt to have an MRI- bilateral breast for c/o breast pain Pt would like to have this appointment at [facility] d/t transportation constraints- I informed her that our office would refer her for  MRI & would call her for details [facility]

## 2018-08-29 ENCOUNTER — Telehealth: Payer: Self-pay

## 2018-08-29 NOTE — Telephone Encounter (Signed)
Call to pt re: her request for pain meds for breast pain- per Dr. Marla Roe she is unable to write the RX for pain meds at this time d/t she has not performed any surgical intervention & pt will need to consult with her PCP for this  She is awaiting a MRI for evaluation of breast pain- which was ordered by Dr. Marla Roe- but has not been completed yet, therefore no diagnosis has been made  Pt will call her PCP for further instruction [place]

## 2018-08-29 NOTE — Telephone Encounter (Signed)
Patient called following up from consult on 08/22/2018. Patient states that she is having "extreme breast pain". Patient took aleve with no relief. Patient asking if Dr. Marla Roe would be willing to prescribe her medication for pain control. Patient's call back number is [phone].

## 2018-09-08 ENCOUNTER — Telehealth: Payer: Self-pay

## 2018-09-08 DIAGNOSIS — Z853 Personal history of malignant neoplasm of breast: Secondary | ICD-10-CM

## 2018-09-08 NOTE — Telephone Encounter (Signed)
 requesting mammogram before mri for history of breast cancer [facility]

## 2018-09-10 ENCOUNTER — Other Ambulatory Visit: Payer: Self-pay | Admitting: Plastic Surgery

## 2018-09-10 ENCOUNTER — Telehealth: Payer: Self-pay

## 2018-09-10 DIAGNOSIS — Z978 Presence of other specified devices: Secondary | ICD-10-CM

## 2018-09-10 DIAGNOSIS — C50912 Malignant neoplasm of unspecified site of left female breast: Secondary | ICD-10-CM

## 2018-09-10 DIAGNOSIS — C50911 Malignant neoplasm of unspecified site of right female breast: Secondary | ICD-10-CM

## 2018-09-10 NOTE — Telephone Encounter (Signed)
Sent orders for: (1) bilateral mammogram, (2) ultrasound-left & (3) ultrasound- right [place]

## 2018-09-11 ENCOUNTER — Ambulatory Visit
Admission: RE | Admit: 2018-09-11 | Discharge: 2018-09-11 | Disposition: A | Discharge status: Home / Self Care | Payer: Medicaid Other | Source: Ambulatory Visit | Attending: Nurse Practitioner | Admitting: Nurse Practitioner

## 2018-09-11 ENCOUNTER — Other Ambulatory Visit: Payer: Self-pay

## 2018-09-11 DIAGNOSIS — N912 Amenorrhea, unspecified: Secondary | ICD-10-CM | POA: Diagnosis present

## 2018-09-11 DIAGNOSIS — R1032 Left lower quadrant pain: Secondary | ICD-10-CM | POA: Insufficient documentation

## 2018-09-11 DIAGNOSIS — R1031 Right lower quadrant pain: Secondary | ICD-10-CM | POA: Diagnosis present

## 2018-09-23 ENCOUNTER — Ambulatory Visit: Payer: Medicaid Other

## 2018-10-07 ENCOUNTER — Other Ambulatory Visit: Payer: Self-pay

## 2018-10-07 ENCOUNTER — Ambulatory Visit
Admission: RE | Admit: 2018-10-07 | Discharge: 2018-10-07 | Disposition: A | Discharge status: Home / Self Care | Payer: Medicaid Other | Source: Ambulatory Visit | Attending: Plastic Surgery | Admitting: Plastic Surgery

## 2018-10-07 DIAGNOSIS — Z978 Presence of other specified devices: Secondary | ICD-10-CM

## 2018-10-31 ENCOUNTER — Other Ambulatory Visit: Payer: Self-pay

## 2018-10-31 NOTE — Progress Notes (Signed)
Patient pre screened for office appointment, no questions or concerns today. 

## 2018-11-02 DIAGNOSIS — D649 Anemia, unspecified: Secondary | ICD-10-CM | POA: Insufficient documentation

## 2018-11-02 NOTE — Progress Notes (Signed)
[place]  [phone] [phone]  ID: Audrey Rowe OB: 1975-08-12  MR#: [id number]  [id number]  Patient Care Team: Danelle Berry, NP as PCP - General (Nurse Practitioner)  CHIEF COMPLAINT: Anemia, unspecified.  INTERVAL HISTORY: Patient is a 43 year old female who was noted to have a declining hemoglobin on routine blood work.  She has chronic weakness and fatigue, but otherwise feels well.  She has no neurologic complaints.  She denies any recent fevers or illnesses.  She has a good appetite and denies weight loss.  She has no chest pain, shortness of breath, cough, or hemoptysis.  She denies any nausea, vomiting, constipation, or diarrhea.  She denies any melena or hematochezia.  She has no urinary complaints.  Patient feels at her baseline offers no further specific complaints today.  REVIEW OF SYSTEMS:   Review of Systems  Constitutional: Positive for malaise/fatigue. Negative for fever and weight loss.  Respiratory: Negative.  Negative for cough, hemoptysis and shortness of breath.   Cardiovascular: Negative.  Negative for chest pain and leg swelling.  Gastrointestinal: Negative.  Negative for abdominal pain.  Genitourinary: Negative.  Negative for dysuria and hematuria.  Musculoskeletal: Negative.  Negative for back pain and myalgias.  Skin: Negative.  Negative for rash.  Neurological: Positive for weakness. Negative for dizziness, focal weakness and headaches.  Psychiatric/Behavioral: Negative.  The patient is not nervous/anxious.     As per HPI. Otherwise, a complete review of systems is negative.  PAST MEDICAL HISTORY: Past Medical History:  Diagnosis Date  . Anemia   . Anxiety   . Breast CA ([place])   . Breast cancer ([place])   . Depression   . GERD (gastroesophageal reflux disease)   . Sleep difficulties     PAST SURGICAL HISTORY: Past Surgical History:  Procedure Laterality Date  . BILATERAL TOTAL MASTECTOMY  WITH AXILLARY LYMPH NODE DISSECTION  2010  . CESAREAN SECTION    . CHOLECYSTECTOMY    . MASTECTOMY  2010  . TUBAL LIGATION      FAMILY HISTORY: Family History  Problem Relation Age of Onset  . Prostate cancer Maternal Grandfather     ADVANCED DIRECTIVES (Y/N):  N  HEALTH MAINTENANCE: Social History   Tobacco Use  . Smoking status: Former Smoker    Quit date: 09/09/2006    Years since quitting: 12.1  . Smokeless tobacco: Never Used  Substance Use Topics  . Alcohol use: Yes    Comment: fifth 3 x per week  . Drug use: Not Currently    Frequency: 1.0 times per week    Types: Marijuana     Colonoscopy:  PAP:  Bone density:  Lipid panel:  Allergies  Allergen Reactions  . Tramadol     Current Outpatient Medications  Medication Sig Dispense Refill  . diazepam (VALIUM) 5 MG tablet Take 1 tablet (5 mg total) by mouth every 8 (eight) hours as needed for anxiety. 20 tablet 0  . famotidine (PEPCID) 20 MG tablet Take 1 tablet (20 mg total) by mouth daily. 30 tablet 1  . FLUoxetine (PROZAC) 20 MG tablet Take 20 mg by mouth daily.    . pantoprazole (PROTONIX) 40 MG tablet Take 1 tablet (40 mg total) by mouth daily. 30 tablet 1  . traZODone (DESYREL) 50 MG tablet Take 50 mg by mouth daily.     No current facility-administered medications for this visit.     OBJECTIVE: There were no vitals filed for this visit.   There  is no height or weight on file to calculate BMI.    ECOG FS:0 - Asymptomatic  General: Well-developed, well-nourished, no acute distress. Eyes: Pink conjunctiva, anicteric sclera. HEENT: Normocephalic, moist mucous membranes, clear oropharnyx. Lungs: Clear to auscultation bilaterally. Heart: Regular rate and rhythm. No rubs, murmurs, or gallops. Abdomen: Soft, nontender, nondistended. No organomegaly noted, normoactive bowel sounds. Musculoskeletal: No edema, cyanosis, or clubbing. Neuro: Alert, answering all questions appropriately. Cranial nerves grossly  intact. Skin: No rashes or petechiae noted. Psych: Normal affect. Lymphatics: No cervical, calvicular, axillary or inguinal LAD.   LAB RESULTS:  Lab Results  Component Value Date   NA 137 08/03/2018   K 3.6 08/03/2018   CL 108 08/03/2018   CO2 19 (L) 08/03/2018   GLUCOSE 79 08/03/2018   BUN 10 08/03/2018   CREATININE 0.56 08/03/2018   CALCIUM 9.0 08/03/2018   PROT 8.1 08/03/2018   ALBUMIN 4.3 08/03/2018   AST 16 08/03/2018   ALT 10 08/03/2018   ALKPHOS 47 08/03/2018   BILITOT 0.6 08/03/2018   GFRNONAA >60 08/03/2018   GFRAA >60 08/03/2018    Lab Results  Component Value Date   WBC 5.8 11/03/2018   HGB 8.6 (L) 11/03/2018   HCT 28.0 (L) 11/03/2018   MCV 80.9 11/03/2018   PLT 237 11/03/2018   Lab Results  Component Value Date   IRON 14 (L) 11/03/2018   TIBC 454 (H) 11/03/2018   IRONPCTSAT 3 (L) 11/03/2018   Lab Results  Component Value Date   FERRITIN 4 (L) 11/03/2018     STUDIES: No results found.  ASSESSMENT: Anemia, unspecified.  PLAN:    1. Anemia, unspecified: Patient's hemoglobin and iron stores are significantly reduced.  She also has an inappropriately normal reticulocyte count.  Erythropoietin level, hemoglobinopathy evaluation, and ANA are pending at time of dictation.  The remainder of her laboratory work was either negative or within normal limits.  Initial plan was to have video enabled telemedicine visit for follow-up, but will convert this to in person visit and patient will receive 510 mg IV Feraheme with her next clinic visit.  Return to clinic in 3 weeks to initiate treatment.  I spent a total of 45 minutes face-to-face with the patient of which greater than 50% of the visit was spent in counseling and coordination of care as detailed above.   Patient expressed understanding and was in agreement with this plan. She also understands that She can call clinic at any time with any questions, concerns, or complaints.    Lloyd Huger, MD    11/04/2018 12:49 PM

## 2018-11-03 ENCOUNTER — Other Ambulatory Visit: Payer: Self-pay

## 2018-11-03 ENCOUNTER — Inpatient Hospital Stay: Payer: Medicaid Other | Attending: Oncology | Admitting: Oncology

## 2018-11-03 ENCOUNTER — Inpatient Hospital Stay: Payer: Medicaid Other

## 2018-11-03 ENCOUNTER — Encounter: Payer: Self-pay | Admitting: Oncology

## 2018-11-03 DIAGNOSIS — Z79899 Other long term (current) drug therapy: Secondary | ICD-10-CM | POA: Insufficient documentation

## 2018-11-03 DIAGNOSIS — R531 Weakness: Secondary | ICD-10-CM | POA: Insufficient documentation

## 2018-11-03 DIAGNOSIS — Z853 Personal history of malignant neoplasm of breast: Secondary | ICD-10-CM | POA: Diagnosis not present

## 2018-11-03 DIAGNOSIS — D509 Iron deficiency anemia, unspecified: Secondary | ICD-10-CM

## 2018-11-03 DIAGNOSIS — D649 Anemia, unspecified: Secondary | ICD-10-CM | POA: Diagnosis present

## 2018-11-03 DIAGNOSIS — F329 Major depressive disorder, single episode, unspecified: Secondary | ICD-10-CM | POA: Diagnosis not present

## 2018-11-03 DIAGNOSIS — Z87891 Personal history of nicotine dependence: Secondary | ICD-10-CM | POA: Diagnosis not present

## 2018-11-03 DIAGNOSIS — R5383 Other fatigue: Secondary | ICD-10-CM | POA: Diagnosis not present

## 2018-11-03 DIAGNOSIS — K219 Gastro-esophageal reflux disease without esophagitis: Secondary | ICD-10-CM | POA: Diagnosis not present

## 2018-11-03 DIAGNOSIS — F419 Anxiety disorder, unspecified: Secondary | ICD-10-CM

## 2018-11-03 LAB — RETICULOCYTES
Immature Retic Fract: 24.2 % — ABNORMAL HIGH (ref 2.3–15.9)
RBC.: 3.46 MIL/uL — ABNORMAL LOW (ref 3.87–5.11)
Retic Count, Absolute: 52.6 10*3/uL (ref 19.0–186.0)
Retic Ct Pct: 1.5 % (ref 0.4–3.1)

## 2018-11-03 LAB — DAT, POLYSPECIFIC AHG (ARMC ONLY): Polyspecific AHG test: NEGATIVE

## 2018-11-03 LAB — FOLATE: Folate: 9 ng/mL (ref >5.9)

## 2018-11-03 LAB — FERRITIN: Ferritin: 4 ng/mL — ABNORMAL LOW (ref 11–307)

## 2018-11-03 LAB — IRON AND TIBC
Iron: 14 ug/dL — ABNORMAL LOW (ref 28–170)
Saturation Ratios: 3 % — ABNORMAL LOW (ref 10.4–31.8)
TIBC: 454 ug/dL — ABNORMAL HIGH (ref 250–450)
UIBC: 440 ug/dL

## 2018-11-03 LAB — VITAMIN B12: Vitamin B-12: 233 pg/mL (ref 180–914)

## 2018-11-03 LAB — CBC
HCT: 28 % — ABNORMAL LOW (ref 36.0–46.0)
Hemoglobin: 8.6 g/dL — ABNORMAL LOW (ref 12.0–15.0)
MCH: 24.9 pg — ABNORMAL LOW (ref 26.0–34.0)
MCHC: 30.7 g/dL (ref 30.0–36.0)
MCV: 80.9 fL (ref 80.0–100.0)
Platelets: 237 10*3/uL (ref 150–400)
RBC: 3.46 MIL/uL — ABNORMAL LOW (ref 3.87–5.11)
RDW: 20.3 % — ABNORMAL HIGH (ref 11.5–15.5)
WBC: 5.8 10*3/uL (ref 4.0–10.5)
nRBC: 0 % (ref 0.0–0.2)

## 2018-11-03 LAB — LACTATE DEHYDROGENASE: LDH: 94 U/L — ABNORMAL LOW (ref 98–192)

## 2018-11-03 NOTE — Progress Notes (Signed)
Patient here today for new consult for anemia.   

## 2018-11-04 DIAGNOSIS — D509 Iron deficiency anemia, unspecified: Secondary | ICD-10-CM | POA: Insufficient documentation

## 2018-11-04 LAB — ERYTHROPOIETIN: Erythropoietin: 99.3 m[IU]/mL — ABNORMAL HIGH (ref 2.6–18.5)

## 2018-11-04 LAB — HEMOGLOBINOPATHY EVALUATION
Hgb A2 Quant: 2.3 % (ref 1.8–3.2)
Hgb A: 97.7 % (ref 96.4–98.8)
Hgb C: 0 %
Hgb F Quant: 0 % (ref 0.0–2.0)
Hgb S Quant: 0 %
Hgb Variant: 0 %

## 2018-11-04 LAB — ANA W/REFLEX: Anti Nuclear Antibody (ANA): NEGATIVE

## 2018-11-06 ENCOUNTER — Ambulatory Visit (INDEPENDENT_AMBULATORY_CARE_PROVIDER_SITE_OTHER): Payer: Medicaid Other | Admitting: Obstetrics and Gynecology

## 2018-11-06 ENCOUNTER — Encounter: Payer: Self-pay | Admitting: Obstetrics and Gynecology

## 2018-11-06 ENCOUNTER — Other Ambulatory Visit: Payer: Self-pay

## 2018-11-06 VITALS — BP 122/80 | HR 82 | Ht 60.0 in | Wt 136.4 lb

## 2018-11-06 DIAGNOSIS — N938 Other specified abnormal uterine and vaginal bleeding: Secondary | ICD-10-CM

## 2018-11-06 DIAGNOSIS — R102 Pelvic and perineal pain: Secondary | ICD-10-CM | POA: Diagnosis not present

## 2018-11-06 DIAGNOSIS — D219 Benign neoplasm of connective and other soft tissue, unspecified: Secondary | ICD-10-CM

## 2018-11-06 MED ORDER — DESOGESTREL-ETHINYL ESTRADIOL 0.15-0.02/0.01 MG (21/5) PO TABS: 1.0000 | ORAL_TABLET | Freq: Every day | ORAL | 2 refills | Status: DC

## 2018-11-06 NOTE — Progress Notes (Signed)
HPI:      [name] is a 43 y.o. No obstetric history on file. who LMP was Patient's last menstrual period was 11/06/2018.  Subjective:   She presents today with complaint of 51-month history of pelvic pressure symptoms.  She also complains of occasional position dependent pain with intercourse.  She says her pelvic pressure symptoms are unrelated to her cycle.  She continues to have monthly regular menses she describes them as heavy lasting 7 days with 3 of the days heavy with cramping. She also states that her she has had issues in the past with bowel movements and continues to experience problems.  She says that she was having bowel movements regularly but recently they have become much more irregular and she often has diarrhea with bowel movements. She recently had an ultrasound which revealed 2 small submucosal uterine fibroids.    Hx: The following portions of the patient's history were reviewed and updated as appropriate:             She  has a past medical history of Anemia, Anxiety, Breast CA ([place]), Breast cancer ([place]), Depression, GERD (gastroesophageal reflux disease), and Sleep difficulties. She does not have any pertinent problems on file. She  has a past surgical history that includes Cholecystectomy; Cesarean section; Mastectomy (2010); Bilateral total mastectomy with axillary lymph node dissection (2010); and Tubal ligation. Her family history includes Prostate cancer in her maternal grandfather. She  reports that she quit smoking about 12 years ago. She has never used smokeless tobacco. She reports current alcohol use. She reports previous drug use. Frequency: 1.00 time per week. Drug: Marijuana. She has a current medication list which includes the following prescription(s): diazepam, famotidine, fluoxetine, pantoprazole, trazodone, and desogestrel-ethinyl estradiol. She is allergic to tramadol.       Review of Systems:  Review of Systems  Constitutional: Denied  constitutional symptoms, night sweats, recent illness, fatigue, fever, insomnia and weight loss.  Eyes: Denied eye symptoms, eye pain, photophobia, vision change and visual disturbance.  Ears/Nose/Throat/Neck: Denied ear, nose, throat or neck symptoms, hearing loss, nasal discharge, sinus congestion and sore throat.  Cardiovascular: Denied cardiovascular symptoms, arrhythmia, chest pain/pressure, edema, exercise intolerance, orthopnea and palpitations.  Respiratory: Denied pulmonary symptoms, asthma, pleuritic pain, productive sputum, cough, dyspnea and wheezing.  Gastrointestinal: Denied, gastro-esophageal reflux, melena, nausea and vomiting.  Genitourinary: See HPI for additional information.  Musculoskeletal: Denied musculoskeletal symptoms, stiffness, swelling, muscle weakness and myalgia.  Dermatologic: Denied dermatology symptoms, rash and scar.  Neurologic: Denied neurology symptoms, dizziness, headache, neck pain and syncope.  Psychiatric: Denied psychiatric symptoms, anxiety and depression.  Endocrine: Denied endocrine symptoms including hot flashes and night sweats.   Meds:   Current Outpatient Medications on File Prior to Visit  Medication Sig Dispense Refill  . diazepam (VALIUM) 5 MG tablet Take 1 tablet (5 mg total) by mouth every 8 (eight) hours as needed for anxiety. 20 tablet 0  . famotidine (PEPCID) 20 MG tablet Take 1 tablet (20 mg total) by mouth daily. 30 tablet 1  . FLUoxetine (PROZAC) 20 MG tablet Take 20 mg by mouth daily.    . pantoprazole (PROTONIX) 40 MG tablet Take 1 tablet (40 mg total) by mouth daily. 30 tablet 1  . traZODone (DESYREL) 50 MG tablet Take 50 mg by mouth daily.     No current facility-administered medications on file prior to visit.     Objective:     Vitals:   11/06/18 1058  BP: 122/80  Pulse:  82              Ultrasound revealing small submucosal uterine fibroids-no other abnormalities noted  Assessment:    No obstetric history on  file. Patient Active Problem List   Diagnosis Date Noted  . Iron deficiency anemia 11/04/2018  . Anemia, unspecified 11/02/2018  . History of breast cancer in female 08/22/2018  . History of reconstruction of both breasts 08/22/2018  . History of bilateral mastectomy 08/22/2018  . Breast pain 08/22/2018  . 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 Fibroids   2. DUB (dysfunctional uterine bleeding)   3. Pelvic pain in female     I believe her heavy menstrual cycles and possibly her pain with intercourse may be related to uterine fibroids.  It seems as if her other pain is unrelated.  Possibly it is a bowel related pain question IBS with the pelvic pressure type symptoms mostly left-sided pain and occasional diarrhea. I have reassured her regarding uterine fibroids and we have discussed them in detail.   Plan:            1.  Patient would like to try cycle control using OCPs.  Will start Mircette and trial for 3 months.  If her additional pelvic pressure type pain does not resolve consider referral to GI.  In the meantime consider use of increased colonic fiber. Orders No orders of the defined types were placed in this encounter.    Meds ordered this encounter  Medications  . desogestrel-ethinyl estradiol (MIRCETTE) 0.15-0.02/0.01 MG (21/5) tablet    Sig: Take 1 tablet by mouth at bedtime.    Dispense:  1 Package    Refill:  2      F/U  Return in about 3 months (around 02/06/2019). I spent 33 minutes involved in the care of this patient of which greater than 50% was spent discussing pelvic pain, ultrasound findings, uterine fibroids, management of uterine fibroids, use of OCPs, use of fiber for bowel issues.  All questions answered.  Finis Bud, M.D. 11/06/2018 12:08 PM

## 2018-11-21 NOTE — Progress Notes (Signed)
[place]  Telephone:(336) [phone] Fax:(336) [phone]  ID: Audrey Rowe OB: 12-19-1975  MR#: [id number]  [id number]  Patient Care Team: Danelle Berry, NP as PCP - General (Nurse Practitioner)  CHIEF COMPLAINT: Iron deficiency anemia.  INTERVAL HISTORY: Patient returns to clinic today for further evaluation, discussion of her laboratory work, and initiation of IV iron.  She continues to have weakness and fatigue, but otherwise feels well.  She has no neurologic complaints.  She denies any recent fevers or illnesses.  She has a good appetite and denies weight loss.  She has no chest pain, shortness of breath, cough, or hemoptysis.  She denies any nausea, vomiting, constipation, or diarrhea.  She denies any melena or hematochezia.  She has no urinary complaints.  Patient offers no further specific complaints today.  REVIEW OF SYSTEMS:   Review of Systems  Constitutional: Positive for malaise/fatigue. Negative for fever and weight loss.  Respiratory: Negative.  Negative for cough, hemoptysis and shortness of breath.   Cardiovascular: Negative.  Negative for chest pain and leg swelling.  Gastrointestinal: Positive for nausea. Negative for abdominal pain, blood in stool and melena.  Genitourinary: Negative.  Negative for dysuria and hematuria.  Musculoskeletal: Negative.  Negative for back pain and myalgias.  Skin: Negative.  Negative for rash.  Neurological: Positive for weakness. Negative for dizziness, focal weakness and headaches.  Psychiatric/Behavioral: Negative.  The patient is not nervous/anxious.     As per HPI. Otherwise, a complete review of systems is negative.  PAST MEDICAL HISTORY: Past Medical History:  Diagnosis Date  . Anemia   . Anxiety   . Breast CA ([place])   . Breast cancer ([place])   . Depression   . GERD (gastroesophageal reflux disease)   . Sleep difficulties     PAST SURGICAL HISTORY: Past Surgical History:  Procedure  Laterality Date  . BILATERAL TOTAL MASTECTOMY WITH AXILLARY LYMPH NODE DISSECTION  2010  . CESAREAN SECTION    . CHOLECYSTECTOMY    . MASTECTOMY  2010  . TUBAL LIGATION      FAMILY HISTORY: Family History  Problem Relation Age of Onset  . Prostate cancer Maternal Grandfather     ADVANCED DIRECTIVES (Y/N):  N  HEALTH MAINTENANCE: Social History   Tobacco Use  . Smoking status: Former Smoker    Quit date: 09/09/2006    Years since quitting: 12.2  . Smokeless tobacco: Never Used  Substance Use Topics  . Alcohol use: Yes    Comment: fifth 3 x per week  . Drug use: Not Currently    Frequency: 1.0 times per week    Types: Marijuana     Colonoscopy:  PAP:  Bone density:  Lipid panel:  Allergies  Allergen Reactions  . Tramadol Other (See Comments)    Insomnia     Current Outpatient Medications  Medication Sig Dispense Refill  . desogestrel-ethinyl estradiol (MIRCETTE) 0.15-0.02/0.01 MG (21/5) tablet Take 1 tablet by mouth at bedtime. 1 Package 2  . diazepam (VALIUM) 5 MG tablet Take 1 tablet (5 mg total) by mouth every 8 (eight) hours as needed for anxiety. 20 tablet 0  . famotidine (PEPCID) 20 MG tablet Take 1 tablet (20 mg total) by mouth daily. 30 tablet 1  . FLUoxetine (PROZAC) 20 MG tablet Take 20 mg by mouth daily.    . pantoprazole (PROTONIX) 40 MG tablet Take 1 tablet (40 mg total) by mouth daily. 30 tablet 1  . traZODone (DESYREL) 50 MG tablet Take 50  mg by mouth daily.     No current facility-administered medications for this visit.     OBJECTIVE: Vitals:   11/25/18 1312  BP: 133/84  Pulse: 76  Resp: 16  Temp: 99.3 F (37.4 C)  SpO2: 100%     Body mass index is 27.21 kg/m.    ECOG FS:0 - Asymptomatic  General: Well-developed, well-nourished, no acute distress. Eyes: Pink conjunctiva, anicteric sclera. HEENT: Normocephalic, moist mucous membranes. Lungs: Clear to auscultation bilaterally. Heart: Regular rate and rhythm. No rubs, murmurs, or  gallops. Abdomen: Soft, nontender, nondistended. No organomegaly noted, normoactive bowel sounds. Musculoskeletal: No edema, cyanosis, or clubbing. Neuro: Alert, answering all questions appropriately. Cranial nerves grossly intact. Skin: No rashes or petechiae noted. Psych: Normal affect.  LAB RESULTS:  Lab Results  Component Value Date   NA 137 08/03/2018   K 3.6 08/03/2018   CL 108 08/03/2018   CO2 19 (L) 08/03/2018   GLUCOSE 79 08/03/2018   BUN 10 08/03/2018   CREATININE 0.56 08/03/2018   CALCIUM 9.0 08/03/2018   PROT 8.1 08/03/2018   ALBUMIN 4.3 08/03/2018   AST 16 08/03/2018   ALT 10 08/03/2018   ALKPHOS 47 08/03/2018   BILITOT 0.6 08/03/2018   GFRNONAA >60 08/03/2018   GFRAA >60 08/03/2018    Lab Results  Component Value Date   WBC 5.8 11/03/2018   HGB 8.6 (L) 11/03/2018   HCT 28.0 (L) 11/03/2018   MCV 80.9 11/03/2018   PLT 237 11/03/2018   Lab Results  Component Value Date   IRON 14 (L) 11/03/2018   TIBC 454 (H) 11/03/2018   IRONPCTSAT 3 (L) 11/03/2018   Lab Results  Component Value Date   FERRITIN 4 (L) 11/03/2018     STUDIES: No results found.  ASSESSMENT: Iron deficiency anemia.  PLAN:    1.  Iron deficiency anemia: Patient's hemoglobin and iron stores are significantly reduced.  She also has an inappropriately normal reticulocyte count.  Erythropoietin level is appropriately elevated.  Folate, B12, hemolysis labs, hemoglobinopathy profile, and ANA are all negative or within normal limits.  Proceed with 510 mg IV Feraheme today.  Return to clinic in 2 weeks after the Thanksgiving holiday for second infusion.  Patient will then return to clinic in 3 months with repeat laboratory work, further evaluation, and consideration of additional treatment.  2.  Nausea: Patient has requested a referral to GI.  I spent a total of 30 minutes face-to-face with the patient of which greater than 50% of the visit was spent in counseling and coordination of care as  detailed above.   Patient expressed understanding and was in agreement with this plan. She also understands that She can call clinic at any time with any questions, concerns, or complaints.    Lloyd Huger, MD   11/25/2018 3:50 PM

## 2018-11-25 ENCOUNTER — Inpatient Hospital Stay: Payer: Medicaid Other

## 2018-11-25 ENCOUNTER — Other Ambulatory Visit: Payer: Self-pay

## 2018-11-25 ENCOUNTER — Inpatient Hospital Stay: Payer: Medicaid Other | Attending: Oncology | Admitting: Oncology

## 2018-11-25 VITALS — BP 132/83 | HR 84 | Resp 18

## 2018-11-25 VITALS — BP 133/84 | HR 76 | Temp 99.3°F | Resp 16 | Wt 139.3 lb

## 2018-11-25 DIAGNOSIS — F419 Anxiety disorder, unspecified: Secondary | ICD-10-CM | POA: Diagnosis not present

## 2018-11-25 DIAGNOSIS — D509 Iron deficiency anemia, unspecified: Secondary | ICD-10-CM

## 2018-11-25 DIAGNOSIS — F329 Major depressive disorder, single episode, unspecified: Secondary | ICD-10-CM | POA: Insufficient documentation

## 2018-11-25 DIAGNOSIS — K219 Gastro-esophageal reflux disease without esophagitis: Secondary | ICD-10-CM | POA: Diagnosis not present

## 2018-11-25 DIAGNOSIS — D649 Anemia, unspecified: Secondary | ICD-10-CM

## 2018-11-25 DIAGNOSIS — R531 Weakness: Secondary | ICD-10-CM | POA: Diagnosis not present

## 2018-11-25 DIAGNOSIS — R5383 Other fatigue: Secondary | ICD-10-CM | POA: Diagnosis not present

## 2018-11-25 DIAGNOSIS — Z79899 Other long term (current) drug therapy: Secondary | ICD-10-CM | POA: Insufficient documentation

## 2018-11-25 DIAGNOSIS — R110 Nausea: Secondary | ICD-10-CM | POA: Diagnosis not present

## 2018-11-25 DIAGNOSIS — Z87891 Personal history of nicotine dependence: Secondary | ICD-10-CM | POA: Diagnosis not present

## 2018-11-25 MED ORDER — SODIUM CHLORIDE 0.9 % IV SOLN
510.0000 mg | Freq: Once | INTRAVENOUS | Status: AC
  Administered 2018-11-25: 510 mg via INTRAVENOUS
  Filled 2018-11-25: qty 17

## 2018-11-25 MED ORDER — SODIUM CHLORIDE 0.9 % IV SOLN
Freq: Once | INTRAVENOUS | Status: AC
  Administered 2018-11-25: 14:00:00 via INTRAVENOUS
  Filled 2018-11-25: qty 250

## 2018-11-25 MED ORDER — ACETAMINOPHEN 325 MG PO TABS
650.0000 mg | ORAL_TABLET | Freq: Once | ORAL | Status: AC
  Administered 2018-11-25: 650 mg via ORAL
  Filled 2018-11-25: qty 2

## 2018-11-25 NOTE — Progress Notes (Signed)
Patient is here for follow up she is doing well does mentions nausea but is looking for a GI doctor.

## 2018-11-28 ENCOUNTER — Encounter: Payer: Self-pay | Admitting: *Deleted

## 2018-12-09 ENCOUNTER — Other Ambulatory Visit: Payer: Self-pay

## 2018-12-10 ENCOUNTER — Other Ambulatory Visit: Payer: Self-pay

## 2018-12-10 ENCOUNTER — Inpatient Hospital Stay: Payer: Medicaid Other | Attending: Oncology

## 2018-12-10 VITALS — BP 114/73 | HR 75 | Resp 16

## 2018-12-10 DIAGNOSIS — D509 Iron deficiency anemia, unspecified: Secondary | ICD-10-CM | POA: Insufficient documentation

## 2018-12-10 MED ORDER — SODIUM CHLORIDE 0.9 % IV SOLN
510.0000 mg | Freq: Once | INTRAVENOUS | Status: AC
  Administered 2018-12-10: 510 mg via INTRAVENOUS
  Filled 2018-12-10: qty 17

## 2018-12-10 MED ORDER — SODIUM CHLORIDE 0.9 % IV SOLN
Freq: Once | INTRAVENOUS | Status: AC
  Administered 2018-12-10: 15:00:00 via INTRAVENOUS
  Filled 2018-12-10: qty 250

## 2019-01-28 ENCOUNTER — Telehealth: Payer: Self-pay

## 2019-01-28 NOTE — Telephone Encounter (Signed)
Spoke with patient to let her know that Dr. Amalia Hailey is not in the clinic today. I told her that she has an appointment next week. I let her know that we do not have any appointments until her scheduled appointment. I told her that if we have anything come open I will let her know. Patient verbalized understanding. She is taking ibuprofen for her pain.

## 2019-01-28 NOTE — Telephone Encounter (Signed)
Pt says period has been on 3 weeks. This morning she says she's bleeding heavy and her back is pain. Would like to be seen today

## 2019-02-05 ENCOUNTER — Encounter: Payer: Medicaid Other | Admitting: Obstetrics and Gynecology

## 2019-02-06 ENCOUNTER — Telehealth: Payer: Self-pay | Admitting: Obstetrics and Gynecology

## 2019-02-06 NOTE — Telephone Encounter (Signed)
Pt called in and stated she was put on mircette at her last visit. The pt stated that she hasn't stopped bleeding  After the second month. Pt is requesting call back. Please advise

## 2019-02-09 NOTE — Telephone Encounter (Signed)
LM for patient to return call.

## 2019-02-09 NOTE — Telephone Encounter (Signed)
Spoke with patient and let her know that it can sometimes take 3 months to get the periods regulated. Patient missed appointment last week so I rescheduled her to come in on 02/24/2019. I let her know that he would discuss further at that time.

## 2019-02-21 NOTE — Progress Notes (Deleted)
[place]  Telephone:(336) [phone] Fax:(336) [phone]  ID: Audrey Rowe OB: 1975/11/29  MR#: [id number]  [id number]  Patient Care Team: Danelle Berry, NP as PCP - General (Nurse Practitioner)  CHIEF COMPLAINT: Iron deficiency anemia.  INTERVAL HISTORY: Patient returns to clinic today for further evaluation, discussion of her laboratory work, and initiation of IV iron.  She continues to have weakness and fatigue, but otherwise feels well.  She has no neurologic complaints.  She denies any recent fevers or illnesses.  She has a good appetite and denies weight loss.  She has no chest pain, shortness of breath, cough, or hemoptysis.  She denies any nausea, vomiting, constipation, or diarrhea.  She denies any melena or hematochezia.  She has no urinary complaints.  Patient offers no further specific complaints today.  REVIEW OF SYSTEMS:   Review of Systems  Constitutional: Positive for malaise/fatigue. Negative for fever and weight loss.  Respiratory: Negative.  Negative for cough, hemoptysis and shortness of breath.   Cardiovascular: Negative.  Negative for chest pain and leg swelling.  Gastrointestinal: Positive for nausea. Negative for abdominal pain, blood in stool and melena.  Genitourinary: Negative.  Negative for dysuria and hematuria.  Musculoskeletal: Negative.  Negative for back pain and myalgias.  Skin: Negative.  Negative for rash.  Neurological: Positive for weakness. Negative for dizziness, focal weakness and headaches.  Psychiatric/Behavioral: Negative.  The patient is not nervous/anxious.     As per HPI. Otherwise, a complete review of systems is negative.  PAST MEDICAL HISTORY: Past Medical History:  Diagnosis Date  . Anemia   . Anxiety   . Breast CA ([place])   . Breast cancer ([place])   . Depression   . GERD (gastroesophageal reflux disease)   . Sleep difficulties     PAST SURGICAL HISTORY: Past Surgical History:  Procedure  Laterality Date  . BILATERAL TOTAL MASTECTOMY WITH AXILLARY LYMPH NODE DISSECTION  2010  . CESAREAN SECTION    . CHOLECYSTECTOMY    . MASTECTOMY  2010  . TUBAL LIGATION      FAMILY HISTORY: Family History  Problem Relation Age of Onset  . Prostate cancer Maternal Grandfather     ADVANCED DIRECTIVES (Y/N):  N  HEALTH MAINTENANCE: Social History   Tobacco Use  . Smoking status: Former Smoker    Quit date: 09/09/2006    Years since quitting: 12.4  . Smokeless tobacco: Never Used  Substance Use Topics  . Alcohol use: Yes    Comment: fifth 3 x per week  . Drug use: Not Currently    Frequency: 1.0 times per week    Types: Marijuana     Colonoscopy:  PAP:  Bone density:  Lipid panel:  Allergies  Allergen Reactions  . Tramadol Other (See Comments)    Insomnia     Current Outpatient Medications  Medication Sig Dispense Refill  . desogestrel-ethinyl estradiol (MIRCETTE) 0.15-0.02/0.01 MG (21/5) tablet Take 1 tablet by mouth at bedtime. 1 Package 2  . diazepam (VALIUM) 5 MG tablet Take 1 tablet (5 mg total) by mouth every 8 (eight) hours as needed for anxiety. 20 tablet 0  . famotidine (PEPCID) 20 MG tablet Take 1 tablet (20 mg total) by mouth daily. 30 tablet 1  . FLUoxetine (PROZAC) 20 MG tablet Take 20 mg by mouth daily.    . pantoprazole (PROTONIX) 40 MG tablet Take 1 tablet (40 mg total) by mouth daily. 30 tablet 1  . traZODone (DESYREL) 50 MG tablet Take 50  mg by mouth daily.     No current facility-administered medications for this visit.    OBJECTIVE: There were no vitals filed for this visit.   There is no height or weight on file to calculate BMI.    ECOG FS:0 - Asymptomatic  General: Well-developed, well-nourished, no acute distress. Eyes: Pink conjunctiva, anicteric sclera. HEENT: Normocephalic, moist mucous membranes. Lungs: Clear to auscultation bilaterally. Heart: Regular rate and rhythm. No rubs, murmurs, or gallops. Abdomen: Soft, nontender,  nondistended. No organomegaly noted, normoactive bowel sounds. Musculoskeletal: No edema, cyanosis, or clubbing. Neuro: Alert, answering all questions appropriately. Cranial nerves grossly intact. Skin: No rashes or petechiae noted. Psych: Normal affect.  LAB RESULTS:  Lab Results  Component Value Date   NA 137 08/03/2018   K 3.6 08/03/2018   CL 108 08/03/2018   CO2 19 (L) 08/03/2018   GLUCOSE 79 08/03/2018   BUN 10 08/03/2018   CREATININE 0.56 08/03/2018   CALCIUM 9.0 08/03/2018   PROT 8.1 08/03/2018   ALBUMIN 4.3 08/03/2018   AST 16 08/03/2018   ALT 10 08/03/2018   ALKPHOS 47 08/03/2018   BILITOT 0.6 08/03/2018   GFRNONAA >60 08/03/2018   GFRAA >60 08/03/2018    Lab Results  Component Value Date   WBC 5.8 11/03/2018   HGB 8.6 (L) 11/03/2018   HCT 28.0 (L) 11/03/2018   MCV 80.9 11/03/2018   PLT 237 11/03/2018   Lab Results  Component Value Date   IRON 14 (L) 11/03/2018   TIBC 454 (H) 11/03/2018   IRONPCTSAT 3 (L) 11/03/2018   Lab Results  Component Value Date   FERRITIN 4 (L) 11/03/2018     STUDIES: No results found.  ASSESSMENT: Iron deficiency anemia.  PLAN:    1.  Iron deficiency anemia: Patient's hemoglobin and iron stores are significantly reduced.  She also has an inappropriately normal reticulocyte count.  Erythropoietin level is appropriately elevated.  Folate, B12, hemolysis labs, hemoglobinopathy profile, and ANA are all negative or within normal limits.  Proceed with 510 mg IV Feraheme today.  Return to clinic in 2 weeks after the Thanksgiving holiday for second infusion.  Patient will then return to clinic in 3 months with repeat laboratory work, further evaluation, and consideration of additional treatment.  2.  Nausea: Patient has requested a referral to GI.  I spent a total of 30 minutes face-to-face with the patient of which greater than 50% of the visit was spent in counseling and coordination of care as detailed above.   Patient  expressed understanding and was in agreement with this plan. She also understands that She can call clinic at any time with any questions, concerns, or complaints.    Lloyd Huger, MD   02/21/2019 11:01 AM

## 2019-02-24 ENCOUNTER — Other Ambulatory Visit: Payer: Self-pay

## 2019-02-24 ENCOUNTER — Encounter: Payer: Self-pay | Admitting: Obstetrics and Gynecology

## 2019-02-24 ENCOUNTER — Ambulatory Visit (INDEPENDENT_AMBULATORY_CARE_PROVIDER_SITE_OTHER): Payer: Medicaid Other | Admitting: Obstetrics and Gynecology

## 2019-02-24 VITALS — BP 149/96 | HR 89 | Ht 60.0 in | Wt 151.8 lb

## 2019-02-24 DIAGNOSIS — D250 Submucous leiomyoma of uterus: Secondary | ICD-10-CM | POA: Diagnosis not present

## 2019-02-24 DIAGNOSIS — B009 Herpesviral infection, unspecified: Secondary | ICD-10-CM | POA: Diagnosis not present

## 2019-02-24 MED ORDER — VALACYCLOVIR HCL 500 MG PO TABS: 500.0000 mg | ORAL_TABLET | Freq: Every day | ORAL | 12 refills | Status: DC

## 2019-02-24 NOTE — Progress Notes (Signed)
HPI:      Audrey Rowe is a 44 y.o. No obstetric history on file. who LMP was No LMP recorded (lmp unknown).  Subjective:   She presents today for follow-up of uterine fibroids and use of OCPs for cycle control.  The patient has elected to begin [facility] and has been using it for the last 3 months.  The first 2 months of her pack she had daily spotting which she describes as very light.  She has now stopped bleeding.  Patient has now brought up a secondary nonrelated issue.  She states she has been diagnosed with herpes and has frequent outbreaks in the same spot on her buttocks approximately every 3 months.  She has been taking episodic treatment for this.    Hx: The following portions of the patient's history were reviewed and updated as appropriate:             She  has a past medical history of Anemia, Anxiety, Breast CA ([place]), Breast cancer ([place]), Depression, GERD (gastroesophageal reflux disease), and Sleep difficulties. She does not have any pertinent problems on file. She  has a past surgical history that includes Cholecystectomy; Cesarean section; Mastectomy (2010); Bilateral total mastectomy with axillary lymph node dissection (2010); and Tubal ligation. Her family history includes Prostate cancer in her maternal grandfather. She  reports that she quit smoking about 12 years ago. She has never used smokeless tobacco. She reports current alcohol use. She reports previous drug use. Frequency: 1.00 time per week. Drug: Marijuana. She has a current medication list which includes the following prescription(s): desogestrel-ethinyl estradiol, diazepam, famotidine, fluoxetine, pantoprazole, trazodone, and valacyclovir. She is allergic to tramadol.       Review of Systems:  Review of Systems  Constitutional: Denied constitutional symptoms, night sweats, recent illness, fatigue, fever, insomnia and weight loss.  Eyes: Denied eye symptoms, eye pain, photophobia, vision change and  visual disturbance.  Ears/Nose/Throat/Neck: Denied ear, nose, throat or neck symptoms, hearing loss, nasal discharge, sinus congestion and sore throat.  Cardiovascular: Denied cardiovascular symptoms, arrhythmia, chest pain/pressure, edema, exercise intolerance, orthopnea and palpitations.  Respiratory: Denied pulmonary symptoms, asthma, pleuritic pain, productive sputum, cough, dyspnea and wheezing.  Gastrointestinal: Denied, gastro-esophageal reflux, melena, nausea and vomiting.  Genitourinary: Denied genitourinary symptoms including symptomatic vaginal discharge, pelvic relaxation issues, and urinary complaints.  Musculoskeletal: Denied musculoskeletal symptoms, stiffness, swelling, muscle weakness and myalgia.  Dermatologic: Denied dermatology symptoms, rash and scar.  Neurologic: Denied neurology symptoms, dizziness, headache, neck pain and syncope.  Psychiatric: Denied psychiatric symptoms, anxiety and depression.  Endocrine: Denied endocrine symptoms including hot flashes and night sweats.   Meds:   Current Outpatient Medications on File Prior to Visit  Medication Sig Dispense Refill  . desogestrel-ethinyl estradiol (MIRCETTE) 0.15-0.02/0.01 MG (21/5) tablet Take 1 tablet by mouth at bedtime. 1 Package 2  . diazepam (VALIUM) 5 MG tablet Take 1 tablet (5 mg total) by mouth every 8 (eight) hours as needed for anxiety. 20 tablet 0  . famotidine (PEPCID) 20 MG tablet Take 1 tablet (20 mg total) by mouth daily. 30 tablet 1  . FLUoxetine (PROZAC) 20 MG tablet Take 20 mg by mouth daily.    . pantoprazole (PROTONIX) 40 MG tablet Take 1 tablet (40 mg total) by mouth daily. 30 tablet 1  . traZODone (DESYREL) 50 MG tablet Take 50 mg by mouth daily.     No current facility-administered medications on file prior to visit.    Objective:     Vitals:  02/24/19 0817  BP: (!) 149/96  Pulse: 89                Assessment:    No obstetric history on file. Patient Active Problem List    Diagnosis Date Noted  . Iron deficiency anemia 11/04/2018  . Anemia, unspecified 11/02/2018  . History of breast cancer in female 08/22/2018  . History of reconstruction of both breasts 08/22/2018  . History of bilateral mastectomy 08/22/2018  . Breast pain 08/22/2018  . 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 Fibroids, submucosal   2. HSV-2 (herpes simplex virus 2) infection     Patient doing well on OCPs at this time although it is unknown whether Luisa Hart will give enough cycle control.   History of recurrent HSV happening 3-4 times per year.   Plan:            1.  Continued use of [place] OCPs and the patient will determine if she has enough cycle control with this.  If she fails consider monthly cycling on OCPs.  2.  Because of the frequency of outbreaks I would recommend daily suppression of HSV.  Patient agrees with this strategy and will begin this. Orders No orders of the defined types were placed in this encounter.    Meds ordered this encounter  Medications  . valACYclovir (VALTREX) 500 MG tablet    Sig: Take 1 tablet (500 mg total) by mouth daily. Can increase to twice a day for 5 days in the event of a recurrence    Dispense:  30 tablet    Refill:  12      F/U  No follow-ups on file. I spent 25 minutes involved in the care of this patient preparing to see the patient by obtaining and reviewing her medical history (including labs, imaging tests and prior procedures), documenting clinical information in the electronic health record (EHR), counseling and coordinating care plans, writing and sending prescriptions, ordering tests or procedures and directly communicating with the patient by discussing pertinent items from her history and physical exam as well as detailing my assessment and plan as noted above so that she has an informed understanding.  All of her questions were answered.  Finis Bud, M.D. 02/24/2019 9:07 AM

## 2019-02-25 ENCOUNTER — Other Ambulatory Visit: Payer: Self-pay | Admitting: Emergency Medicine

## 2019-02-25 DIAGNOSIS — D509 Iron deficiency anemia, unspecified: Secondary | ICD-10-CM

## 2019-02-27 ENCOUNTER — Inpatient Hospital Stay: Payer: Medicaid Other | Admitting: Oncology

## 2019-02-27 ENCOUNTER — Inpatient Hospital Stay: Payer: Medicaid Other

## 2019-02-27 ENCOUNTER — Inpatient Hospital Stay: Payer: Medicaid Other | Attending: Oncology

## 2019-03-23 ENCOUNTER — Other Ambulatory Visit: Payer: Self-pay | Admitting: Obstetrics and Gynecology

## 2019-03-23 DIAGNOSIS — N938 Other specified abnormal uterine and vaginal bleeding: Secondary | ICD-10-CM

## 2019-03-23 DIAGNOSIS — D219 Benign neoplasm of connective and other soft tissue, unspecified: Secondary | ICD-10-CM

## 2019-05-27 ENCOUNTER — Other Ambulatory Visit: Payer: Self-pay | Admitting: Nurse Practitioner

## 2019-05-27 DIAGNOSIS — Z1231 Encounter for screening mammogram for malignant neoplasm of breast: Secondary | ICD-10-CM

## 2019-06-22 ENCOUNTER — Inpatient Hospital Stay: Admission: RE | Admit: 2019-06-22 | Payer: Medicaid Other | Source: Ambulatory Visit

## 2019-06-24 DIAGNOSIS — Z9013 Acquired absence of bilateral breasts and nipples: Secondary | ICD-10-CM | POA: Insufficient documentation

## 2019-07-29 ENCOUNTER — Emergency Department: Payer: Medicaid Other

## 2019-07-29 ENCOUNTER — Other Ambulatory Visit: Payer: Self-pay

## 2019-07-29 ENCOUNTER — Encounter: Payer: Self-pay | Admitting: Emergency Medicine

## 2019-07-29 ENCOUNTER — Emergency Department
Admission: EM | Admit: 2019-07-29 | Discharge: 2019-07-29 | Disposition: A | Discharge status: Home / Self Care | Payer: Medicaid Other | Attending: Emergency Medicine | Admitting: Emergency Medicine

## 2019-07-29 DIAGNOSIS — M7731 Calcaneal spur, right foot: Secondary | ICD-10-CM | POA: Diagnosis not present

## 2019-07-29 DIAGNOSIS — Z87891 Personal history of nicotine dependence: Secondary | ICD-10-CM | POA: Insufficient documentation

## 2019-07-29 DIAGNOSIS — X500XXA Overexertion from strenuous movement or load, initial encounter: Secondary | ICD-10-CM | POA: Diagnosis not present

## 2019-07-29 DIAGNOSIS — M79671 Pain in right foot: Secondary | ICD-10-CM | POA: Diagnosis present

## 2019-07-29 MED ORDER — LIDOCAINE 5 % EX PTCH: 1.0000 | MEDICATED_PATCH | Freq: Two times a day (BID) | CUTANEOUS | 0 refills | Status: AC

## 2019-07-29 MED ORDER — LIDOCAINE 5 % EX PTCH
1.0000 | MEDICATED_PATCH | CUTANEOUS | Status: DC
  Administered 2019-07-29: 1 via TRANSDERMAL
  Filled 2019-07-29: qty 1

## 2019-07-29 MED ORDER — NAPROXEN 500 MG PO TABS: 500.0000 mg | ORAL_TABLET | Freq: Two times a day (BID) | ORAL | 0 refills | Status: DC

## 2019-07-29 MED ORDER — NAPROXEN 500 MG PO TABS
500.0000 mg | ORAL_TABLET | Freq: Once | ORAL | Status: AC
  Administered 2019-07-29: 500 mg via ORAL
  Filled 2019-07-29: qty 1

## 2019-07-29 NOTE — ED Provider Notes (Signed)
[facility] Emergency Department Provider Note   ____________________________________________   First MD Initiated Contact with Patient 07/29/19 1323     (approximate)  I have reviewed the triage vital signs and the nursing notes.   HISTORY  Chief Complaint Foot Injury    HPI Audrey Rowe is a 44 y.o. female patient complain of bilateral plantar foot pain.  Patient said the right is worse than the left.  Patient states complaint started 3 to 4 weeks ago.  Patient denies provocative incident except for prolonged standing and walking which is required for her job.  Patient rates pain a 7/10.  Patient described the pain as "achy".  No palliative measure for complaint.         Past Medical History:  Diagnosis Date  . Anemia   . Anxiety   . Breast CA ([place])   . Breast cancer ([place])   . Depression   . GERD (gastroesophageal reflux disease)   . Sleep difficulties     Patient Active Problem List   Diagnosis Date Noted  . Iron deficiency anemia 11/04/2018  . Anemia, unspecified 11/02/2018  . History of breast cancer in female 08/22/2018  . History of reconstruction of both breasts 08/22/2018  . History of bilateral mastectomy 08/22/2018  . Breast pain 08/22/2018  . 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 BILATERAL TOTAL MASTECTOMY WITH AXILLARY LYMPH NODE DISSECTION  2010  . CESAREAN SECTION    . CHOLECYSTECTOMY    . MASTECTOMY  2010  . TUBAL LIGATION      Prior to Admission medications   Medication Sig Start Date End Date Taking? Authorizing Provider  diazepam (VALIUM) 5 MG tablet Take 1 tablet (5 mg total) by mouth every 8 (eight) hours as needed for anxiety. 01/16/18   Earleen Newport, MD  famotidine (PEPCID) 20 MG tablet Take 1 tablet (20 mg total) by mouth daily. 05/23/18 05/23/19  Nance Pear, MD  FLUoxetine (PROZAC) 20 MG tablet Take 20 mg by mouth daily.     [provider]  lidocaine (LIDODERM) 5 % Place 1 patch onto the skin every 12 (twelve) hours. Remove & Discard patch within 12 hours or as directed by MD 07/29/19 07/28/20  Sable Feil, PA-C  naproxen (NAPROSYN) 500 MG tablet Take 1 tablet (500 mg total) by mouth 2 (two) times daily with a meal. 07/29/19   Sable Feil, PA-C  pantoprazole (PROTONIX) 40 MG tablet Take 1 tablet (40 mg total) by mouth daily. 08/03/18 08/03/19  Schuyler Amor, MD  traZODone (DESYREL) 50 MG tablet Take 50 mg by mouth daily. 09/17/18   [provider]  valACYclovir (VALTREX) 500 MG tablet Take 1 tablet (500 mg total) by mouth daily. Can increase to twice a day for 5 days in the event of a recurrence 02/24/19   Harlin Heys, MD  VIORELE 0.15-0.02/0.01 MG (21/5) tablet TAKE 1 TABLET BY MOUTH AT BEDTIME 03/24/19   Harlin Heys, MD    Allergies Tramadol  Family History  Problem Relation Age of Onset  . Prostate cancer Maternal Grandfather     Social History Social History   Tobacco Use  . Smoking status: Former Smoker    Quit date: 09/09/2006    Years since quitting: 12.8  . Smokeless tobacco: Never Used  Vaping Use  . Vaping Use: Never used  Substance Use Topics  . Alcohol use: Yes  Comment: fifth 3 x per week  . Drug use: Not Currently    Frequency: 1.0 times per week    Types: Marijuana    Review of Systems Constitutional: No fever/chills Eyes: No visual changes. ENT: No sore throat. Cardiovascular: Denies chest pain. Respiratory: Denies shortness of breath. Gastrointestinal: No abdominal pain.  No nausea, no vomiting.  No diarrhea.  No constipation. Genitourinary: Negative for dysuria. Musculoskeletal: Bilateral foot pain Skin: Negative for rash. Neurological: Negative for headaches, focal weakness or numbness. Psychiatric:  Anxiety Allergic/Immunilogical: Tramadol ____________________________________________   PHYSICAL EXAM:  VITAL SIGNS: ED Triage  Vitals  Enc Vitals Group     BP 07/29/19 1327 129/81     Pulse Rate 07/29/19 1327 80     Resp --      Temp 07/29/19 1327 98.3 F (36.8 C)     Temp Source 07/29/19 1327 Oral     SpO2 07/29/19 1327 100 %     Weight 07/29/19 1328 150 lb (68 kg)     Height 07/29/19 1328 5' (1.524 m)     Head Circumference --      Peak Flow --      Pain Score 07/29/19 1327 7     Pain Loc --      Pain Edu? --      Excl. in [place]? --    Constitutional: Alert and oriented. Well appearing and in no acute distress. Neck: No stridor.   Hematological/Lymphatic/Immunilogical: No cervical lymphadenopathy. Cardiovascular: Normal rate, regular rhythm. Grossly normal heart sounds.  Good peripheral circulation. Respiratory: Normal respiratory effort.  No retractions. Lungs CTAB. Musculoskeletal: No lower extremity tenderness nor edema.  No joint effusions. Neurologic:  Normal speech and language. No gross focal neurologic deficits are appreciated. No gait instability. Skin:  Skin is warm, dry and intact. No rash noted. Psychiatric: Mood and affect are normal. Speech and behavior are normal.  ____________________________________________   LABS (all labs ordered are listed, but only abnormal results are displayed)  Labs Reviewed - No data to display ____________________________________________  EKG   ____________________________________________  RADIOLOGY  ED MD interpretation:    Official radiology report(s): DG Ankle Complete Right  Result Date: 07/29/2019 CLINICAL DATA:  RIGHT heel pain for 3-4 weeks, pain along bottom of foot, no known injury EXAM: RIGHT ANKLE - COMPLETE 3+ VIEW COMPARISON:  None FINDINGS: Osseous mineralization normal. Joint spaces preserved. No fracture, dislocation, or bone destruction. IMPRESSION: Normal exam. Electronically Signed   By: Lavonia Dana M.D.   On: 07/29/2019 14:35    ____________________________________________   PROCEDURES  Procedure(s) performed (including  Critical Care):  Procedures   ____________________________________________   INITIAL IMPRESSION / ASSESSMENT AND PLAN / ED COURSE  As part of my medical decision making, I reviewed the following data within the [place]     Patient complain of bilateral foot pain for 3 to 4 weeks.  Patient x-rays unremarkable except for small heel spur.  Patient complaining physical exam consistent with plantar fasciitis.  Patient given discharge care instruction advised use Lidoderm patches and take medication as directed.  Further evaluation by podiatry is warranted.    Audrey Rowe was evaluated in Emergency Department on 07/29/2019 for the symptoms described in the history of present illness. She was evaluated in the context of the global COVID-19 pandemic, which necessitated consideration that the patient might be at risk for infection with the SARS-CoV-2 virus that causes COVID-19. Institutional protocols and algorithms that pertain to the evaluation of patients at  risk for COVID-19 are in a state of rapid change based on information released by regulatory bodies including the CDC and federal and state organizations. These policies and algorithms were followed during the patient's care in the ED.       ____________________________________________   FINAL CLINICAL IMPRESSION(S) / ED DIAGNOSES  Final diagnoses:  Calcaneal spur of right foot     ED Discharge Orders         Ordered    naproxen (NAPROSYN) 500 MG tablet  2 times daily with meals     Discontinue  Reprint     07/29/19 1449    lidocaine (LIDODERM) 5 %  Every 12 hours     Discontinue  Reprint     07/29/19 1449           Note:  This document was prepared using Dragon voice recognition software and may include unintentional dictation errors.    Sable Feil, PA-C 07/29/19 1452    Lavonia Drafts, MD 07/29/19 1504

## 2019-07-29 NOTE — Discharge Instructions (Addendum)
Follow discharge care instructions and purchase over-the-counter heel spur cushions.  Cut Lidoderm Derm patches, apply to posterior heel, and take medication as directed.  Follow-up with podiatry if no improvement or worsening complaint after 1 week.

## 2019-07-29 NOTE — ED Triage Notes (Signed)
Pt with bilateral foot pain, right worse than left, that started 3-4 weeks ago. Pt denies injury. Pain along bottom of foot.

## 2019-08-03 DIAGNOSIS — G5603 Carpal tunnel syndrome, bilateral upper limbs: Secondary | ICD-10-CM | POA: Insufficient documentation

## 2019-08-13 ENCOUNTER — Encounter: Payer: Self-pay | Admitting: Emergency Medicine

## 2019-08-13 ENCOUNTER — Emergency Department: Payer: Medicaid Other

## 2019-08-13 ENCOUNTER — Emergency Department
Admission: EM | Admit: 2019-08-13 | Discharge: 2019-08-13 | Disposition: A | Discharge status: Home / Self Care | Payer: Medicaid Other | Attending: Emergency Medicine | Admitting: Emergency Medicine

## 2019-08-13 DIAGNOSIS — Z87891 Personal history of nicotine dependence: Secondary | ICD-10-CM | POA: Diagnosis not present

## 2019-08-13 DIAGNOSIS — J069 Acute upper respiratory infection, unspecified: Secondary | ICD-10-CM | POA: Insufficient documentation

## 2019-08-13 DIAGNOSIS — R509 Fever, unspecified: Secondary | ICD-10-CM | POA: Diagnosis present

## 2019-08-13 DIAGNOSIS — Z20822 Contact with and (suspected) exposure to covid-19: Secondary | ICD-10-CM | POA: Insufficient documentation

## 2019-08-13 DIAGNOSIS — Z853 Personal history of malignant neoplasm of breast: Secondary | ICD-10-CM | POA: Insufficient documentation

## 2019-08-13 LAB — SARS CORONAVIRUS 2 BY RT PCR (HOSPITAL ORDER, PERFORMED IN ~~LOC~~ HOSPITAL LAB): SARS Coronavirus 2: NEGATIVE

## 2019-08-13 MED ORDER — METHOCARBAMOL 500 MG PO TABS: 500.0000 mg | ORAL_TABLET | Freq: Three times a day (TID) | ORAL | 0 refills | Status: AC | PRN

## 2019-08-13 MED ORDER — MELOXICAM 15 MG PO TABS: 15.0000 mg | ORAL_TABLET | Freq: Every day | ORAL | 1 refills | Status: AC

## 2019-08-13 NOTE — ED Provider Notes (Signed)
Emergency Department Provider Note  ____________________________________________  Time seen: Approximately 9:23 PM  I have reviewed the triage vital signs and the nursing notes.   HISTORY  Chief Complaint Cough and Fever   Historian Patient  HPI Audrey Rowe is a 44 y.o. female presents to the emergency department with nonproductive cough, nasal congestion and low-grade fever for the past 2 days.  Patient denies chest pain, chest tightness or shortness of breath.  No nausea or abdominal pain.  Patient did state that she had episodes of emesis that occurred yesterday.  She states that she works with [employer] but denies known contacts with COVID-19.  No recent travel.   Past Medical History:  Diagnosis Date  . Anemia   . Anxiety   . Breast CA ([place])   . Breast cancer ([place])   . Depression   . GERD (gastroesophageal reflux disease)   . Sleep difficulties      Immunizations up to date:  Yes.     Past Medical History:  Diagnosis Date  . Anemia   . Anxiety   . Breast CA ([place])   . Breast cancer ([place])   . Depression   . GERD (gastroesophageal reflux disease)   . Sleep difficulties     Patient Active Problem List   Diagnosis Date Noted  . Iron deficiency anemia 11/04/2018  . Anemia, unspecified 11/02/2018  . History of breast cancer in female 08/22/2018  . History of reconstruction of both breasts 08/22/2018  . History of bilateral mastectomy 08/22/2018  . Breast pain 08/22/2018  . 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 BILATERAL TOTAL MASTECTOMY WITH AXILLARY LYMPH NODE DISSECTION  2010  . CESAREAN SECTION    . CHOLECYSTECTOMY    . MASTECTOMY  2010  . TUBAL LIGATION      Prior to Admission medications   Medication Sig Start Date End Date Taking? Authorizing Provider  diazepam (VALIUM) 5 MG tablet Take 1 tablet (5 mg total) by mouth every 8 (eight) hours as needed for anxiety.  01/16/18   Earleen Newport, MD  famotidine (PEPCID) 20 MG tablet Take 1 tablet (20 mg total) by mouth daily. 05/23/18 05/23/19  Nance Pear, MD  FLUoxetine (PROZAC) 20 MG tablet Take 20 mg by mouth daily.    [provider]  lidocaine (LIDODERM) 5 % Place 1 patch onto the skin every 12 (twelve) hours. Remove & Discard patch within 12 hours or as directed by MD 07/29/19 07/28/20  Sable Feil, PA-C  meloxicam (MOBIC) 15 MG tablet Take 1 tablet (15 mg total) by mouth daily for 7 days. 08/13/19 08/20/19  Lannie Fields, PA-C  methocarbamol (ROBAXIN) 500 MG tablet Take 1 tablet (500 mg total) by mouth every 8 (eight) hours as needed for up to 5 days. 08/13/19 08/18/19  Lannie Fields, PA-C  naproxen (NAPROSYN) 500 MG tablet Take 1 tablet (500 mg total) by mouth 2 (two) times daily with a meal. 07/29/19   Sable Feil, PA-C  pantoprazole (PROTONIX) 40 MG tablet Take 1 tablet (40 mg total) by mouth daily. 08/03/18 08/03/19  Schuyler Amor, MD  traZODone (DESYREL) 50 MG tablet Take 50 mg by mouth daily. 09/17/18   [provider]  valACYclovir (VALTREX) 500 MG tablet Take 1 tablet (500 mg total) by mouth daily. Can increase to twice a day for 5 days in the event of a recurrence 02/24/19   Harlin Heys,  MD  VIORELE 0.15-0.02/0.01 MG (21/5) tablet TAKE 1 TABLET BY MOUTH AT BEDTIME 03/24/19   Harlin Heys, MD    Allergies Tramadol  Family History  Problem Relation Age of Onset  . Prostate cancer Maternal Grandfather     Social History Social History   Tobacco Use  . Smoking status: Former Smoker    Quit date: 09/09/2006    Years since quitting: 12.9  . Smokeless tobacco: Never Used  Vaping Use  . Vaping Use: Never used  Substance Use Topics  . Alcohol use: Yes    Comment: fifth 3 x per week  . Drug use: Not Currently    Frequency: 1.0 times per week    Types: Marijuana     Review of Systems  Constitutional: Patient has fever.  Eyes: No visual changes. No  discharge ENT: Patient has congestion.  Cardiovascular: no chest pain. Respiratory: Patient has cough.  Gastrointestinal: No abdominal pain.  No nausea, no vomiting. Patient had diarrhea.  Genitourinary: Negative for dysuria. No hematuria Musculoskeletal: Patient has myalgias.  Skin: Negative for rash, abrasions, lacerations, ecchymosis. Neurological: Patient has headache, no focal weakness or numbness.     ____________________________________________   PHYSICAL EXAM:  VITAL SIGNS: ED Triage Vitals  Enc Vitals Group     BP 08/13/19 1943 126/78     Pulse Rate 08/13/19 1943 (!) 108     Resp 08/13/19 1943 18     Temp 08/13/19 1943 98.7 F (37.1 C)     Temp Source 08/13/19 1943 Oral     SpO2 08/13/19 1943 100 %     Weight 08/13/19 1942 145 lb (65.8 kg)     Height 08/13/19 1942 5' (1.524 m)     Head Circumference --      Peak Flow --      Pain Score --      Pain Loc --      Pain Edu? --      Excl. in [place]? --      Constitutional: Alert and oriented. Patient is lying supine. Eyes: Conjunctivae are normal. PERRL. EOMI. Head: Atraumatic. ENT:      Ears: Tympanic membranes are mildly injected with mild effusion bilaterally.       Nose: No congestion/rhinnorhea.      Mouth/Throat: Mucous membranes are moist. Posterior pharynx is mildly erythematous.  Hematological/Lymphatic/Immunilogical: No cervical lymphadenopathy.  Cardiovascular: Normal rate, regular rhythm. Normal S1 and S2.  Good peripheral circulation. Respiratory: Normal respiratory effort without tachypnea or retractions. Lungs CTAB. Good air entry to the bases with no decreased or absent breath sounds. Gastrointestinal: Bowel sounds 4 quadrants. Soft and nontender to palpation. No guarding or rigidity. No palpable masses. No distention. No CVA tenderness. Musculoskeletal: Full range of motion to all extremities. No gross deformities appreciated. Neurologic:  Normal speech and language. No gross focal neurologic  deficits are appreciated.  Skin:  Skin is warm, dry and intact. No rash noted. Psychiatric: Mood and affect are normal. Speech and behavior are normal. Patient exhibits appropriate insight and judgement.    ____________________________________________   LABS (all labs ordered are listed, but only abnormal results are displayed)  Labs Reviewed  SARS CORONAVIRUS 2 BY RT PCR (HOSPITAL ORDER, [place] [facility])   ____________________________________________  EKG   ____________________________________________  RADIOLOGY Unk Pinto, personally viewed and evaluated these images (plain radiographs) as part of my medical decision making, as well as reviewing the written report by the radiologist.  DG Chest 2  View  Result Date: 08/13/2019 CLINICAL DATA:  Cough EXAM: CHEST - 2 VIEW COMPARISON:  08/03/2018 FINDINGS: The heart size and mediastinal contours are within normal limits. Both lungs are clear. Mild scoliosis of the spine. IMPRESSION: No active cardiopulmonary disease. Electronically Signed   By: Donavan Foil M.D.   On: 08/13/2019 20:25    ____________________________________________    PROCEDURES  Procedure(s) performed:     Procedures     Medications - No data to display   ____________________________________________   INITIAL IMPRESSION / ASSESSMENT AND PLAN / ED COURSE  Pertinent labs & imaging results that were available during my care of the patient were reviewed by me and considered in my medical decision making (see chart for details).      Assessment and plan Viral URI with cough 44 year old female presents to the emergency department with 2 days of nonproductive cough, nasal congestion and rhinorrhea.  Patient was mildly tachycardic at triage but vital signs were otherwise reassuring.  No consolidations, opacities or infiltrates on chest x-ray.  COVID-19 testing was negative.  Unspecified viral URI is likely at this time.   Patient was discharged with meloxicam and Robaxin for body aches and myalgias associated with viral infection.  Return precautions were given to return with new or worsening symptoms.   ____________________________________________  FINAL CLINICAL IMPRESSION(S) / ED DIAGNOSES  Final diagnoses:  Viral upper respiratory tract infection      NEW MEDICATIONS STARTED DURING THIS VISIT:  ED Discharge Orders         Ordered    meloxicam (MOBIC) 15 MG tablet  Daily     Discontinue  Reprint     08/13/19 2121    methocarbamol (ROBAXIN) 500 MG tablet  Every 8 hours PRN     Discontinue  Reprint     08/13/19 2121              This chart was dictated using voice recognition software/Dragon. Despite best efforts to proofread, errors can occur which can change the meaning. Any change was purely unintentional.     Karren Cobble 08/13/19 2128    Nance Pear, MD 08/13/19 2240

## 2019-08-13 NOTE — ED Triage Notes (Signed)
Pt c/o cough, nasal drainage and fever x2 days. Pt denies SOb and chest pain.

## 2019-09-09 ENCOUNTER — Ambulatory Visit (INDEPENDENT_AMBULATORY_CARE_PROVIDER_SITE_OTHER): Payer: Medicaid Other

## 2019-09-09 ENCOUNTER — Ambulatory Visit (INDEPENDENT_AMBULATORY_CARE_PROVIDER_SITE_OTHER): Payer: Medicaid Other | Admitting: Podiatry

## 2019-09-09 ENCOUNTER — Encounter: Payer: Self-pay | Admitting: Podiatry

## 2019-09-09 ENCOUNTER — Other Ambulatory Visit: Payer: Self-pay

## 2019-09-09 DIAGNOSIS — M722 Plantar fascial fibromatosis: Secondary | ICD-10-CM

## 2019-09-09 MED ORDER — CELECOXIB 100 MG PO CAPS
100.0000 mg | ORAL_CAPSULE | Freq: Two times a day (BID) | ORAL | 3 refills | Status: DC
Start: 2019-09-09 — End: 2022-12-26

## 2019-09-09 MED ORDER — METHYLPREDNISOLONE 4 MG PO TBPK
ORAL_TABLET | ORAL | 0 refills | Status: DC
Start: 2019-09-09 — End: 2019-10-14

## 2019-09-09 NOTE — Progress Notes (Signed)
Subjective:  Patient ID: Audrey Rowe, female    DOB: 05-Feb-1975,  MRN: [id number] HPI Chief Complaint  Patient presents with  . Foot Pain    Patient presents today for bilat heel/arch pain x 1 month right much worse than left.  She says its a constant sharp stabbing pain and its worse by the end of the day.  She has been using Lidocaine patches and Meloxicam which helps some    44 y.o. female presents with the above complaint.   ROS: Denies fever chills nausea vomiting muscle aches pains calf pain back pain chest pain shortness of breath.  Past Medical History:  Diagnosis Date  . Anemia   . Anxiety   . Breast CA ([place])   . Breast cancer ([place])   . Depression   . GERD (gastroesophageal reflux disease)   . Sleep difficulties    Past Surgical History:  Procedure Laterality Date  . BILATERAL TOTAL MASTECTOMY WITH AXILLARY LYMPH NODE DISSECTION  2010  . CESAREAN SECTION    . CHOLECYSTECTOMY    . MASTECTOMY  2010  . TUBAL LIGATION      Current Outpatient Medications:  .  busPIRone (BUSPAR) 15 MG tablet, buspirone 15 mg tablet, Disp: , Rfl:  .  prazosin (MINIPRESS) 1 MG capsule, prazosin 1 mg capsule, Disp: , Rfl:  .  celecoxib (CELEBREX) 100 MG capsule, Take 1 capsule (100 mg total) by mouth 2 (two) times daily., Disp: 60 capsule, Rfl: 3 .  diazepam (VALIUM) 5 MG tablet, Take 1 tablet (5 mg total) by mouth every 8 (eight) hours as needed for anxiety., Disp: 20 tablet, Rfl: 0 .  famotidine (PEPCID) 20 MG tablet, Take 1 tablet (20 mg total) by mouth daily., Disp: 30 tablet, Rfl: 1 .  FLUoxetine (PROZAC) 20 MG tablet, Take 20 mg by mouth daily., Disp: , Rfl:  .  hydrOXYzine (ATARAX/VISTARIL) 25 MG tablet, hydroxyzine HCl 25 mg tablet, Disp: , Rfl:  .  lidocaine (LIDODERM) 5 %, Place 1 patch onto the skin every 12 (twelve) hours. Remove & Discard patch within 12 hours or as directed by MD, Disp: 10 patch, Rfl: 0 .  methylPREDNISolone (MEDROL DOSEPAK) 4 MG TBPK tablet, 6 day  dose pack - take as directed, Disp: 21 tablet, Rfl: 0 .  traZODone (DESYREL) 50 MG tablet, Take 50 mg by mouth daily., Disp: , Rfl:  .  valACYclovir (VALTREX) 500 MG tablet, Take 1 tablet (500 mg total) by mouth daily. Can increase to twice a day for 5 days in the event of a recurrence, Disp: 30 tablet, Rfl: 12 .  VIORELE 0.15-0.02/0.01 MG (21/5) tablet, TAKE 1 TABLET BY MOUTH AT BEDTIME, Disp: 28 tablet, Rfl: 2  Allergies  Allergen Reactions  . Tramadol Other (See Comments)    Insomnia    Review of Systems Objective:  There were no vitals filed for this visit.  General: Well developed, nourished, in no acute distress, alert and oriented x3   Dermatological: Skin is warm, dry and supple bilateral. Nails x 10 are well maintained; remaining integument appears unremarkable at this time. There are no open sores, no preulcerative lesions, no rash or signs of infection present.  Vascular: Dorsalis Pedis artery and Posterior Tibial artery pedal pulses are 2/4 bilateral with immedate capillary fill time. Pedal hair growth present. No varicosities and no lower extremity edema present bilateral.   Neruologic: Grossly intact via light touch bilateral. Vibratory intact via tuning fork bilateral. Protective threshold with Semmes Wienstein monofilament intact to  all pedal sites bilateral. Patellar and Achilles deep tendon reflexes 2+ bilateral. No Babinski or clonus noted bilateral.   Musculoskeletal: No gross boney pedal deformities bilateral. No pain, crepitus, or limitation noted with foot and ankle range of motion bilateral. Muscular strength 5/5 in all groups tested bilateral.  She has pain on palpation mid calcaneal tubercle no pain on medial lateral compression of the calcaneus.    Gait: Unassisted, Nonantalgic.    Radiographs:  Radiographs taken today demonstrate an osseously mature individual soft tissue increase in density plantar skin insertion site of the right heel over the left.  No  acute findings noted.  Assessment & Plan:   Assessment: Plantar fasciitis.  Plan: Discussed etiology pathology conservative versus surgical therapies at this point I injected the heel with 20 mg Kenalog 5 mg Marcaine point of maximal tenderness.  Tolerated procedure well without complications.  Placed her in a plantar fascial brace to be followed by a night splint.  Discussed appropriate shoe gear stretching exercise ice therapy sugar modifications I also started her on methylprednisolone to be followed by Celebrex     Audrey Rowe, [place]

## 2019-09-09 NOTE — Patient Instructions (Signed)

## 2019-10-14 ENCOUNTER — Encounter: Payer: Self-pay | Admitting: Podiatry

## 2019-10-14 ENCOUNTER — Ambulatory Visit: Payer: Medicaid Other | Admitting: Podiatry

## 2019-10-14 ENCOUNTER — Other Ambulatory Visit: Payer: Self-pay

## 2019-10-14 ENCOUNTER — Ambulatory Visit: Payer: Medicaid Other | Admitting: Orthotics

## 2019-10-14 DIAGNOSIS — M722 Plantar fascial fibromatosis: Secondary | ICD-10-CM

## 2019-10-14 NOTE — Progress Notes (Signed)
Patient scanned; will decide if she wants f/o as she had Medicaid as primary

## 2019-10-14 NOTE — Progress Notes (Signed)
She presents today for follow-up of her bilateral plantar fasciitis states that they are doing better she is also scanned for orthotics today.  Objective: Vital signs are stable alert oriented x3 left foot is doing much better on palpation the right foot is still painful to palpation.  Pulses remain palpable.  No open lesions or wounds are noted.  Assessment: Resolving plantar fasciitis about 80%.  Plan: Went ahead and reinjected the right heel today 20 mg Kenalog 5 mg of Marcaine and she will get the orthotics.  She will continue Celebrex follow-up with me once the orthotics come in.

## 2019-11-11 ENCOUNTER — Encounter: Payer: Medicaid Other | Admitting: Orthotics

## 2019-12-12 ENCOUNTER — Other Ambulatory Visit: Payer: Self-pay | Admitting: Obstetrics and Gynecology

## 2019-12-12 DIAGNOSIS — N938 Other specified abnormal uterine and vaginal bleeding: Secondary | ICD-10-CM

## 2019-12-12 DIAGNOSIS — D219 Benign neoplasm of connective and other soft tissue, unspecified: Secondary | ICD-10-CM

## 2019-12-14 NOTE — Telephone Encounter (Signed)
LM for patient to return call. Need to fine out if patient has started back taking medication. Dr. Amalia Hailey has not filled in 8 months. There was only a 3 month supply given when it was prescribed.

## 2020-02-24 ENCOUNTER — Encounter: Payer: Medicaid Other | Admitting: Orthotics

## 2020-03-31 ENCOUNTER — Other Ambulatory Visit: Payer: Medicaid Other

## 2020-05-23 ENCOUNTER — Ambulatory Visit (INDEPENDENT_AMBULATORY_CARE_PROVIDER_SITE_OTHER): Payer: Medicaid Other

## 2020-05-23 ENCOUNTER — Other Ambulatory Visit: Payer: Self-pay

## 2020-05-23 DIAGNOSIS — M722 Plantar fascial fibromatosis: Secondary | ICD-10-CM

## 2020-05-23 NOTE — Progress Notes (Signed)
Patient presents for orthotic pick up.  Verbal and written break in and wear instructions given.  Patient will follow up in 4 weeks with Dr if symptoms worsen or fail to improve. 

## 2020-05-23 NOTE — Patient Instructions (Signed)

## 2020-06-27 ENCOUNTER — Ambulatory Visit: Payer: Medicaid Other | Admitting: Podiatry

## 2020-06-29 ENCOUNTER — Encounter: Payer: Self-pay | Admitting: Emergency Medicine

## 2020-06-29 ENCOUNTER — Other Ambulatory Visit: Payer: Self-pay

## 2020-06-29 ENCOUNTER — Emergency Department: Payer: Medicaid Other

## 2020-06-29 ENCOUNTER — Emergency Department
Admission: EM | Admit: 2020-06-29 | Discharge: 2020-06-29 | Disposition: A | Discharge status: Home / Self Care | Payer: Medicaid Other | Attending: Emergency Medicine | Admitting: Emergency Medicine

## 2020-06-29 DIAGNOSIS — X58XXXA Exposure to other specified factors, initial encounter: Secondary | ICD-10-CM | POA: Insufficient documentation

## 2020-06-29 DIAGNOSIS — S29012A Strain of muscle and tendon of back wall of thorax, initial encounter: Secondary | ICD-10-CM | POA: Insufficient documentation

## 2020-06-29 DIAGNOSIS — M25552 Pain in left hip: Secondary | ICD-10-CM | POA: Diagnosis not present

## 2020-06-29 DIAGNOSIS — Z87891 Personal history of nicotine dependence: Secondary | ICD-10-CM | POA: Diagnosis not present

## 2020-06-29 DIAGNOSIS — Z853 Personal history of malignant neoplasm of breast: Secondary | ICD-10-CM | POA: Diagnosis not present

## 2020-06-29 DIAGNOSIS — S29019A Strain of muscle and tendon of unspecified wall of thorax, initial encounter: Secondary | ICD-10-CM

## 2020-06-29 DIAGNOSIS — S299XXA Unspecified injury of thorax, initial encounter: Secondary | ICD-10-CM | POA: Diagnosis present

## 2020-06-29 LAB — PREGNANCY, URINE: Preg Test, Ur: NEGATIVE

## 2020-06-29 MED ORDER — KETOROLAC TROMETHAMINE 60 MG/2ML IM SOLN
30.0000 mg | Freq: Once | INTRAMUSCULAR | Status: AC
  Administered 2020-06-29: 30 mg via INTRAMUSCULAR
  Filled 2020-06-29: qty 2

## 2020-06-29 MED ORDER — METHYLPREDNISOLONE 4 MG PO TBPK
ORAL_TABLET | ORAL | 0 refills | Status: DC
Start: 2020-06-29 — End: 2020-08-10

## 2020-06-29 MED ORDER — OXYCODONE-ACETAMINOPHEN 5-325 MG PO TABS: 1.0000 | ORAL_TABLET | ORAL | 0 refills | Status: AC | PRN

## 2020-06-29 MED ORDER — OXYCODONE-ACETAMINOPHEN 5-325 MG PO TABS
1.0000 | ORAL_TABLET | Freq: Once | ORAL | Status: AC
  Administered 2020-06-29: 1 via ORAL
  Filled 2020-06-29: qty 1

## 2020-06-29 MED ORDER — DIAZEPAM 2 MG PO TABS
2.0000 mg | ORAL_TABLET | Freq: Once | ORAL | Status: AC
  Administered 2020-06-29: 2 mg via ORAL
  Filled 2020-06-29: qty 1

## 2020-06-29 MED ORDER — DIAZEPAM 2 MG PO TABS: 2.0000 mg | ORAL_TABLET | Freq: Three times a day (TID) | ORAL | 0 refills | Status: AC | PRN

## 2020-06-29 MED ORDER — NAPROXEN 500 MG PO TABS: 500.0000 mg | ORAL_TABLET | Freq: Two times a day (BID) | ORAL | 0 refills | Status: AC

## 2020-06-29 NOTE — Discharge Instructions (Signed)
1.  Take steroid taper as prescribed (Medrol Dosepak). 2.  You may take medicines as needed for pain and muscle spasms (Naprosyn/Percocet/Valium). 3.  Apply moist heat to affected area several times daily. 4.  Return to the ER for worsening symptoms, persistent vomiting, difficulty breathing or other concerns.

## 2020-06-29 NOTE — ED Provider Notes (Signed)
-----------------------------------------   7:00 AM on 06/29/2020 -----------------------------------------  Blood pressure 123/90, pulse 88, temperature 98 F (36.7 C), temperature source Oral, resp. rate 18, height 5' (1.524 m), weight 68.5 kg, last menstrual period 06/26/2020, SpO2 100 %.  Assuming care from Dr. Beather Arbour.  In short, Audrey Rowe is a 45 y.o. female with a chief complaint of Back Pain .  Refer to the original H&P for additional details.  The current plan of care is to follow-up XR results and reassess.  ----------------------------------------- 7:52 AM on 06/29/2020 ----------------------------------------- X-rays are negative for acute process, patient reports improvement in symptoms.  She is appropriate for discharge home with PCP follow-up, has been prescribed muscle relaxants, steroids, and pain medication per Dr. Beather Arbour.    Blake Divine, MD 06/29/20 [phone]

## 2020-06-29 NOTE — ED Triage Notes (Signed)
Patient ambulatory to triage with steady gait, without difficulty or distress noted; pt reports pain to spine going into left hip for several wks; denies any known injury

## 2020-06-29 NOTE — ED Provider Notes (Signed)
[facility] Emergency Department Provider Note   ____________________________________________   Event Date/Time   First MD Initiated Contact with Patient 06/29/20 [phone]     (approximate)  I have reviewed the triage vital signs and the nursing notes.   HISTORY  Chief Complaint Back Pain    HPI Audrey Rowe is a 45 y.o. female who presents to the ED from home with a chief complaint of nontraumatic back pain.  Patient reports a 2 to 3-week history of upper back pain with spasms.  Performs repetitive twisting type motion at work.  Denies extremity weakness but notes occasional tingling sensation.  Denies history of herniated disks.  For the past several days patient has felt pain into her left hip radiating to her buttock.  Denies extremity weakness, numbness or tingling.  Denies bowel or bladder incontinence.  Voices no other complaints or injuries     Past Medical History:  Diagnosis Date   Anemia    Anxiety    Breast CA ([place])    Breast cancer ([place])    Depression    GERD (gastroesophageal reflux disease)    Sleep difficulties     Patient Active Problem List   Diagnosis Date Noted   Bilateral carpal tunnel syndrome 08/03/2019   Acquired absence of bilateral breasts and nipples 06/24/2019   Iron deficiency anemia 11/04/2018   Anemia, unspecified 11/02/2018   Vitamin D deficiency 08/27/2018   History of breast cancer in female 08/22/2018   History of reconstruction of both breasts 08/22/2018   History of bilateral mastectomy 08/22/2018   Breast pain 08/22/2018   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 Authorizing Provider  diazepam (VALIUM) 2 MG tablet Take 1 tablet (2 mg total) by mouth every 8 (eight) hours as needed for muscle spasms. 06/29/20  Yes Paulette Blanch, MD  methylPREDNISolone (MEDROL DOSEPAK) 4 MG TBPK tablet Take as directed 06/29/20  Yes Paulette Blanch, MD  naproxen (NAPROSYN) 500 MG tablet Take 1 tablet (500 mg total) by mouth 2 (two) times daily with a meal. 06/29/20  Yes Paulette Blanch, MD  oxyCODONE-acetaminophen (PERCOCET/ROXICET) 5-325 MG tablet Take 1 tablet by mouth every 4 (four) hours as needed for severe pain. 06/29/20  Yes Paulette Blanch, MD  busPIRone (BUSPAR) 15 MG tablet buspirone 15 mg tablet 04/16/19   [provider]  celecoxib (CELEBREX) 100 MG capsule Take 1 capsule (100 mg total) by mouth 2 (two) times daily. 09/09/19   Hyatt, Max T, DPM  chlorthalidone (HYGROTON) 25 MG tablet TAKE 1 TABLET BY MOUTH DAILY IN THE MORNING 09/15/19   [provider]  famotidine (PEPCID) 20 MG tablet Take 1 tablet (20 mg total) by mouth daily. 05/23/18 05/23/19  Nance Pear, MD  ferrous sulfate 325 (65 FE) MG tablet Take by mouth.    [provider]  FLUoxetine (PROZAC) 20 MG tablet Take 20 mg by mouth daily.    [provider]  hydrochlorothiazide (HYDRODIURIL) 25 MG tablet Take by mouth.  [provider]  hydrOXYzine (ATARAX/VISTARIL) 25 MG tablet hydroxyzine HCl 25 mg tablet    [provider]  lidocaine (LIDODERM) 5 % Place 1 patch onto the skin every 12 (twelve) hours. Remove & Discard patch within 12 hours or as directed by MD 07/29/19 07/28/20  Sable Feil, PA-C  prazosin (MINIPRESS) 1 MG capsule prazosin 1 mg capsule 04/16/19   [provider]  traZODone (DESYREL) 50 MG tablet Take 50 mg by mouth daily. 09/17/18   [provider]  valACYclovir (VALTREX) 500 MG tablet Take 1 tablet (500 mg total) by mouth daily. Can increase  to twice a day for 5 days in the event of a recurrence 02/24/19   Harlin Heys, MD  VIORELE 0.15-0.02/0.01 MG (21/5) tablet TAKE 1 TABLET BY MOUTH AT BEDTIME 03/24/19   Harlin Heys, MD    Allergies Tramadol  Family History  Problem Relation Age of Onset   Prostate cancer Maternal Grandfather     Social History Social History   Tobacco Use   Smoking status: Former    Pack years: 0.00    Types: Cigarettes    Quit date: 09/09/2006    Years since quitting: 13.8   Smokeless tobacco: Never  Vaping Use   Vaping Use: Never used  Substance Use Topics   Alcohol use: Yes    Comment: fifth 3 x per week   Drug use: Not Currently    Frequency: 1.0 times per week    Types: Marijuana    Review of Systems  Constitutional: No fever/chills Eyes: No visual changes. ENT: No sore throat. Cardiovascular: Denies chest pain. Respiratory: Denies shortness of breath. Gastrointestinal: No abdominal pain.  No nausea, no vomiting.  No diarrhea.  No constipation. Genitourinary: Negative for dysuria. Musculoskeletal: Positive for back pain. Skin: Negative for rash. Neurological: Negative for headaches, focal weakness or numbness.   ____________________________________________   PHYSICAL EXAM:  VITAL SIGNS: ED Triage Vitals  Enc Vitals Group     BP 06/29/20 0501 123/90     Pulse Rate 06/29/20 0501 88     Resp 06/29/20 0501 18     Temp 06/29/20 0501 98 F (36.7 C)     Temp Source 06/29/20 0501 Oral     SpO2 06/29/20 0501 100 %     Weight 06/29/20 0458 151 lb (68.5 kg)     Height 06/29/20 0458 5' (1.524 m)     Head Circumference --      Peak Flow --      Pain Score 06/29/20 0458 9     Pain Loc --      Pain Edu? --      Excl. in [place]? --     Constitutional: Alert and oriented. Well appearing and in no acute distress. Eyes: Conjunctivae are normal. PERRL. EOMI. Head: Atraumatic. Nose: No congestion/rhinnorhea. Mouth/Throat: Mucous membranes are moist.   Neck: No stridor.   No cervical spine tenderness to palpation. Cardiovascular: Normal rate, regular rhythm. Grossly normal heart sounds.  Good peripheral circulation. Respiratory: Normal respiratory effort.  No retractions. Lungs CTAB. Gastrointestinal: Soft and nontender. No distention. No abdominal bruits. No CVA tenderness. Musculoskeletal: No spinal tenderness to palpation.  Paraspinal thoracic muscle spasms.  Pelvis stable.  Left hip and buttock tender to palpation with full range of motion.  No edema.  No joint effusions. Neurologic:  Normal speech and language. No gross focal neurologic deficits are appreciated. No gait instability. Skin:  Skin is warm, dry and intact. No rash  noted. Psychiatric: Mood and affect are normal. Speech and behavior are normal.  ____________________________________________   LABS (all labs ordered are listed, but only abnormal results are displayed)  Labs Reviewed  PREGNANCY, URINE   ____________________________________________  EKG  None ____________________________________________  RADIOLOGY I, Regis Hinton J, personally viewed and evaluated these images (plain radiographs) as part of my medical decision making, as well as reviewing the written report by the radiologist.  ED MD interpretation: Pending  Official radiology report(s): No results found.  ____________________________________________   PROCEDURES  Procedure(s) performed (including Critical Care):  Procedures   ____________________________________________   INITIAL IMPRESSION / ASSESSMENT AND PLAN / ED COURSE  As part of my medical decision making, I reviewed the following data within the [place] History obtained from family, Nursing notes reviewed and incorporated, Old chart reviewed, Radiograph reviewed, Notes from prior ED visits, and [place] Controlled Substance Database     45 year old female presenting with nontraumatic thoracic strain and left hip pain.  Suspect element  of cervical radiculopathy.  Will obtain plain film images of cervical and thoracic spine, left hip.  Administer IM Toradol, Percocet and Valium.  Will reassess.  Clinical Course as of 06/29/20 0656  Wed Jun 29, 2020  0656 Patient had an x-ray.  Care will be transferred to the oncoming provider pending x-ray results.  Anticipate discharge home with prescriptions for Medrol Dosepak, NSAIDs, analgesia and muscle relaxer. [JS]    Clinical Course User Index [JS] Paulette Blanch, MD     ____________________________________________   FINAL CLINICAL IMPRESSION(S) / ED DIAGNOSES  Final diagnoses:  Thoracic myofascial strain, initial encounter  Left hip pain     ED Discharge Orders          Ordered    methylPREDNISolone (MEDROL DOSEPAK) 4 MG TBPK tablet        06/29/20 0609    naproxen (NAPROSYN) 500 MG tablet  2 times daily with meals        06/29/20 0609    oxyCODONE-acetaminophen (PERCOCET/ROXICET) 5-325 MG tablet  Every 4 hours PRN        06/29/20 0609    diazepam (VALIUM) 2 MG tablet  Every 8 hours PRN        06/29/20 0609             Note:  This document was prepared using Dragon voice recognition software and may include unintentional dictation errors.    Paulette Blanch, MD 06/29/20 [phone]

## 2020-08-02 ENCOUNTER — Telehealth: Payer: Self-pay

## 2020-08-02 NOTE — Telephone Encounter (Signed)
-----   Message from Secundino Ginger sent at 08/02/2020  9:00 AM EDT ----- Regarding: Pt left msg w/ answering service Pt left msg w/ answering service that she needs to make an appt with Dr Grayland Ormond.

## 2020-08-02 NOTE — Telephone Encounter (Signed)
Patient last seen by Dr. Grayland Ormond on 11/25/18 with plan for 3 month f/u but she was NS on 02/27/19.  MyChart message sent to patient asking if she has any problems or just like a f/u appts.

## 2020-08-02 NOTE — Telephone Encounter (Signed)
Patient responded via MyChart, please see MyChat note.

## 2020-08-07 NOTE — Progress Notes (Signed)
[facility]  [phone]) [phone] [phone]) [phone]  ID: Audrey Rowe OB: 1975-05-22  MR#: [id number]  [id number]  Patient Care Team: Danelle Berry, NP as PCP - General (Nurse Practitioner)  CHIEF COMPLAINT: Iron deficiency anemia.  INTERVAL HISTORY: Patient last evaluated in clinic on November 25, 2019.  She returns to clinic as an add-on after complaints of increasing weakness and fatigue over the past month.  She otherwise feels well and is asymptomatic. She has no neurologic complaints.  She denies any recent fevers or illnesses.  She has a good appetite and denies weight loss.  She has no chest pain, shortness of breath, cough, or hemoptysis.  She denies any nausea, vomiting, constipation, or diarrhea.  She denies any melena or hematochezia.  She has no urinary complaints.  Patient offers no further specific complaints today.  REVIEW OF SYSTEMS:   Review of Systems  Constitutional:  Positive for malaise/fatigue. Negative for fever and weight loss.  Respiratory: Negative.  Negative for cough, hemoptysis and shortness of breath.   Cardiovascular: Negative.  Negative for chest pain and leg swelling.  Gastrointestinal: Negative.  Negative for abdominal pain, blood in stool, melena and nausea.  Genitourinary: Negative.  Negative for dysuria and hematuria.  Musculoskeletal: Negative.  Negative for back pain and myalgias.  Skin: Negative.  Negative for rash.  Neurological:  Positive for weakness. Negative for dizziness, focal weakness and headaches.  Psychiatric/Behavioral: Negative.  The patient is not nervous/anxious.    As per HPI. Otherwise, a complete review of systems is negative.  PAST MEDICAL HISTORY: Past Medical History:  Diagnosis Date   Anemia    Anxiety    Breast CA ([facility])    Breast cancer ([place])    Depression    GERD (gastroesophageal reflux disease)    Sleep difficulties     PAST SURGICAL HISTORY: Past Surgical History:   Procedure Laterality Date   BILATERAL TOTAL MASTECTOMY WITH AXILLARY LYMPH NODE DISSECTION  2010   CESAREAN SECTION     CHOLECYSTECTOMY     MASTECTOMY  2010   TUBAL LIGATION      FAMILY HISTORY: Family History  Problem Relation Age of Onset   Prostate cancer Maternal Grandfather     ADVANCED DIRECTIVES (Y/N):  N  HEALTH MAINTENANCE: Social History   Tobacco Use   Smoking status: Former    Types: Cigarettes    Quit date: 09/09/2006    Years since quitting: 13.9   Smokeless tobacco: Never  Vaping Use   Vaping Use: Never used  Substance Use Topics   Alcohol use: Yes    Comment: fifth 3 x per week   Drug use: Not Currently    Frequency: 1.0 times per week     Colonoscopy:  PAP:  Bone density:  Lipid panel:  Allergies  Allergen Reactions   Tramadol Other (See Comments)    Insomnia     Current Outpatient Medications  Medication Sig Dispense Refill   baclofen (LIORESAL) 10 MG tablet Take 10 mg by mouth 2 (two) times daily.     busPIRone (BUSPAR) 15 MG tablet buspirone 15 mg tablet     celecoxib (CELEBREX) 100 MG capsule Take 1 capsule (100 mg total) by mouth 2 (two) times daily. 60 capsule 3   chlorthalidone (HYGROTON) 25 MG tablet TAKE 1 TABLET BY MOUTH DAILY IN THE MORNING     cyclobenzaprine (FLEXERIL) 10 MG tablet cyclobenzaprine 10 mg tablet  Take 1 tablet twice a day by oral route.  diazepam (VALIUM) 2 MG tablet Take 1 tablet (2 mg total) by mouth every 8 (eight) hours as needed for muscle spasms. 15 tablet 0   famotidine (PEPCID) 20 MG tablet Take 1 tablet (20 mg total) by mouth daily. 30 tablet 1   ferrous sulfate 325 (65 FE) MG tablet Take by mouth.     hydrochlorothiazide (HYDRODIURIL) 25 MG tablet Take by mouth.     hydrOXYzine (ATARAX/VISTARIL) 25 MG tablet hydroxyzine HCl 25 mg tablet     Levonorgestrel-Ethinyl Estradiol (AMETHIA) 0.15-0.03 &0.01 MG tablet Take 1 tablet by mouth at bedtime. 84 tablet 3   meloxicam (MOBIC) 15 MG tablet meloxicam 15  mg tablet  Take 1 tablet every day by oral route.     naproxen (NAPROSYN) 500 MG tablet Take 1 tablet (500 mg total) by mouth 2 (two) times daily with a meal. 14 tablet 0   prazosin (MINIPRESS) 1 MG capsule prazosin 1 mg capsule     valACYclovir (VALTREX) 500 MG tablet Take 1 tablet (500 mg total) by mouth daily. Can increase to twice a day for 5 days in the event of a recurrence 30 tablet 12   FLUoxetine (PROZAC) 20 MG capsule Take 60 mg by mouth every morning.     FLUoxetine (PROZAC) 20 MG tablet Take 20 mg by mouth daily. (Patient not taking: Reported on 08/10/2020)     oxyCODONE-acetaminophen (PERCOCET/ROXICET) 5-325 MG tablet Take 1 tablet by mouth every 4 (four) hours as needed for severe pain. (Patient not taking: No sig reported) 15 tablet 0   traZODone (DESYREL) 50 MG tablet Take 50 mg by mouth daily. (Patient not taking: No sig reported)     VIORELE 0.15-0.02/0.01 MG (21/5) tablet TAKE 1 TABLET BY MOUTH AT BEDTIME (Patient not taking: No sig reported) 28 tablet 2   No current facility-administered medications for this visit.    OBJECTIVE: Vitals:   08/10/20 1017  BP: 116/73  Pulse: 75  Resp: 16  Temp: 97.9 F (36.6 C)     Body mass index is 30.15 kg/m.    ECOG FS:0 - Asymptomatic  General: Well-developed, well-nourished, no acute distress. Eyes: Pink conjunctiva, anicteric sclera. HEENT: Normocephalic, moist mucous membranes. Lungs: No audible wheezing or coughing. Heart: Regular rate and rhythm. Abdomen: Soft, nontender, no obvious distention. Musculoskeletal: No edema, cyanosis, or clubbing. Neuro: Alert, answering all questions appropriately. Cranial nerves grossly intact. Skin: No rashes or petechiae noted. Psych: Normal affect.   LAB RESULTS:  Lab Results  Component Value Date   NA 137 08/03/2018   K 3.6 08/03/2018   CL 108 08/03/2018   CO2 19 (L) 08/03/2018   GLUCOSE 79 08/03/2018   BUN 10 08/03/2018   CREATININE 0.56 08/03/2018   CALCIUM 9.0 08/03/2018    PROT 8.1 08/03/2018   ALBUMIN 4.3 08/03/2018   AST 16 08/03/2018   ALT 10 08/03/2018   ALKPHOS 47 08/03/2018   BILITOT 0.6 08/03/2018   GFRNONAA >60 08/03/2018   GFRAA >60 08/03/2018    Lab Results  Component Value Date   WBC 7.3 08/10/2020   NEUTROABS 4.1 08/10/2020   HGB 10.8 (L) 08/10/2020   HCT 33.0 (L) 08/10/2020   MCV 92.4 08/10/2020   PLT 213 08/10/2020   Lab Results  Component Value Date   IRON 24 (L) 08/10/2020   TIBC 420 08/10/2020   IRONPCTSAT 6 (L) 08/10/2020   Lab Results  Component Value Date   FERRITIN 3 (L) 08/10/2020     STUDIES: No results found.  ASSESSMENT: Iron deficiency anemia.  PLAN:    1.  Iron deficiency anemia: Patient's hemoglobin and iron stores have trended down and she is symptomatic.  Previously, all of her other laboratory work was either negative or within normal limits including hemoglobinopathy profile.  Patient last received treatment with IV Feraheme on December 10, 2018.  Proceed with 5 doses of 200 mg IV Venofer over the next 2 to 3 weeks.  Patient will then return to clinic in 4 months with repeat laboratory work and video assisted telemedicine visit.  2.  History of breast cancer: Patient underwent bilateral mastectomy in 2010.  I spent a total of 30 minutes reviewing chart data, face-to-face evaluation with the patient, counseling and coordination of care as detailed above.    Patient expressed understanding and was in agreement with this plan. She also understands that She can call clinic at any time with any questions, concerns, or complaints.    Lloyd Huger, MD   08/10/2020 11:04 AM

## 2020-08-10 ENCOUNTER — Encounter: Payer: Self-pay | Admitting: Oncology

## 2020-08-10 ENCOUNTER — Ambulatory Visit (INDEPENDENT_AMBULATORY_CARE_PROVIDER_SITE_OTHER): Payer: Medicaid Other | Admitting: Obstetrics and Gynecology

## 2020-08-10 ENCOUNTER — Other Ambulatory Visit: Payer: Self-pay

## 2020-08-10 ENCOUNTER — Other Ambulatory Visit (HOSPITAL_COMMUNITY)
Admission: RE | Admit: 2020-08-10 | Discharge: 2020-08-10 | Disposition: A | Discharge status: Home / Self Care | Payer: Medicaid Other | Source: Ambulatory Visit | Attending: Obstetrics and Gynecology | Admitting: Obstetrics and Gynecology

## 2020-08-10 ENCOUNTER — Inpatient Hospital Stay: Payer: Medicaid Other | Attending: Oncology

## 2020-08-10 ENCOUNTER — Encounter: Payer: Self-pay | Admitting: Obstetrics and Gynecology

## 2020-08-10 ENCOUNTER — Inpatient Hospital Stay (HOSPITAL_BASED_OUTPATIENT_CLINIC_OR_DEPARTMENT_OTHER): Payer: Medicaid Other | Admitting: Oncology

## 2020-08-10 VITALS — BP 116/77 | HR 81 | Ht 60.0 in | Wt 155.0 lb

## 2020-08-10 VITALS — BP 116/73 | HR 75 | Temp 97.9°F | Resp 16 | Wt 154.4 lb

## 2020-08-10 DIAGNOSIS — D509 Iron deficiency anemia, unspecified: Secondary | ICD-10-CM | POA: Insufficient documentation

## 2020-08-10 DIAGNOSIS — B009 Herpesviral infection, unspecified: Secondary | ICD-10-CM | POA: Diagnosis not present

## 2020-08-10 DIAGNOSIS — Z1231 Encounter for screening mammogram for malignant neoplasm of breast: Secondary | ICD-10-CM

## 2020-08-10 DIAGNOSIS — Z124 Encounter for screening for malignant neoplasm of cervix: Secondary | ICD-10-CM | POA: Insufficient documentation

## 2020-08-10 DIAGNOSIS — Z01419 Encounter for gynecological examination (general) (routine) without abnormal findings: Secondary | ICD-10-CM | POA: Diagnosis not present

## 2020-08-10 DIAGNOSIS — Z0000 Encounter for general adult medical examination without abnormal findings: Secondary | ICD-10-CM | POA: Diagnosis not present

## 2020-08-10 DIAGNOSIS — D250 Submucous leiomyoma of uterus: Secondary | ICD-10-CM

## 2020-08-10 LAB — IRON AND TIBC
Iron: 24 ug/dL — ABNORMAL LOW (ref 28–170)
Saturation Ratios: 6 % — ABNORMAL LOW (ref 10.4–31.8)
TIBC: 420 ug/dL (ref 250–450)
UIBC: 396 ug/dL

## 2020-08-10 LAB — CBC WITH DIFFERENTIAL/PLATELET
Abs Immature Granulocytes: 0.03 10*3/uL (ref 0.00–0.07)
Basophils Absolute: 0.1 10*3/uL (ref 0.0–0.1)
Basophils Relative: 1 %
Eosinophils Absolute: 0.2 10*3/uL (ref 0.0–0.5)
Eosinophils Relative: 2 %
HCT: 33 % — ABNORMAL LOW (ref 36.0–46.0)
Hemoglobin: 10.8 g/dL — ABNORMAL LOW (ref 12.0–15.0)
Immature Granulocytes: 0 %
Lymphocytes Relative: 29 %
Lymphs Abs: 2.1 10*3/uL (ref 0.7–4.0)
MCH: 30.3 pg (ref 26.0–34.0)
MCHC: 32.7 g/dL (ref 30.0–36.0)
MCV: 92.4 fL (ref 80.0–100.0)
Monocytes Absolute: 0.8 10*3/uL (ref 0.1–1.0)
Monocytes Relative: 10 %
Neutro Abs: 4.1 10*3/uL (ref 1.7–7.7)
Neutrophils Relative %: 58 %
Platelets: 213 10*3/uL (ref 150–400)
RBC: 3.57 MIL/uL — ABNORMAL LOW (ref 3.87–5.11)
RDW: 15.1 % (ref 11.5–15.5)
Smear Review: NORMAL
WBC: 7.3 10*3/uL (ref 4.0–10.5)
nRBC: 0 % (ref 0.0–0.2)

## 2020-08-10 LAB — FERRITIN: Ferritin: 3 ng/mL — ABNORMAL LOW (ref 11–307)

## 2020-08-10 MED ORDER — LEVONORGEST-ETH ESTRAD 91-DAY 0.15-0.03 &0.01 MG PO TABS: 1.0000 | ORAL_TABLET | Freq: Every day | ORAL | 3 refills | Status: DC

## 2020-08-10 MED ORDER — VALACYCLOVIR HCL 500 MG PO TABS: 500.0000 mg | ORAL_TABLET | Freq: Every day | ORAL | 12 refills | Status: AC

## 2020-08-10 NOTE — Addendum Note (Signed)
Addended by: Durwin Glaze on: 08/10/2020 08:42 AM   Modules accepted: Orders

## 2020-08-10 NOTE — Progress Notes (Addendum)
HPI:      [name] is a 45 y.o. No obstetric history on file. who LMP was Patient's last menstrual period was 08/04/2020.  Subjective:   She presents today for her annual examination.  She continues to take OCPs and has normal regular cycles every 3 months.  She was happy with this because it controls her bleeding with fibroids. She is taking Valtrex for suppression however she continues to have outbreaks in the same place on her buttocks associated with her menses.  She says the Valtrex decreases the outbreak and makes it less symptomatic but it still occurs. Of significant note patient has had a bilateral mastectomy for breast cancer and had implants placed this last year.  She is more than 5 years cancer free and no longer on tamoxifen.    Hx: The following portions of the patient's history were reviewed and updated as appropriate:             She  has a past medical history of Anemia, Anxiety, Breast CA ([place]), Breast cancer ([place]), Depression, GERD (gastroesophageal reflux disease), and Sleep difficulties. She does not have any pertinent problems on file. She  has a past surgical history that includes Cholecystectomy; Cesarean section; Mastectomy (2010); Bilateral total mastectomy with axillary lymph node dissection (2010); and Tubal ligation. Her family history includes Prostate cancer in her maternal grandfather. She  reports that she quit smoking about 13 years ago. She has never used smokeless tobacco. She reports current alcohol use. She reports previous drug use. Frequency: 1.00 time per week. She has a current medication list which includes the following prescription(s): buspirone, celecoxib, chlorthalidone, diazepam, ferrous sulfate, fluoxetine, hydrochlorothiazide, hydroxyzine, naproxen, prazosin, valacyclovir, famotidine, oxycodone-acetaminophen, trazodone, and viorele. She is allergic to tramadol.       Review of Systems:  Review of Systems  Constitutional: Denied  constitutional symptoms, night sweats, recent illness, fatigue, fever, insomnia and weight loss.  Eyes: Denied eye symptoms, eye pain, photophobia, vision change and visual disturbance.  Ears/Nose/Throat/Neck: Denied ear, nose, throat or neck symptoms, hearing loss, nasal discharge, sinus congestion and sore throat.  Cardiovascular: Denied cardiovascular symptoms, arrhythmia, chest pain/pressure, edema, exercise intolerance, orthopnea and palpitations.  Respiratory: Denied pulmonary symptoms, asthma, pleuritic pain, productive sputum, cough, dyspnea and wheezing.  Gastrointestinal: Denied, gastro-esophageal reflux, melena, nausea and vomiting.  Genitourinary: Denied genitourinary symptoms including symptomatic vaginal discharge, pelvic relaxation issues, and urinary complaints.  Musculoskeletal: Denied musculoskeletal symptoms, stiffness, swelling, muscle weakness and myalgia.  Dermatologic: Denied dermatology symptoms, rash and scar.  Neurologic: Denied neurology symptoms, dizziness, headache, neck pain and syncope.  Psychiatric: Denied psychiatric symptoms, anxiety and depression.  Endocrine: Denied endocrine symptoms including hot flashes and night sweats.   Meds:   Current Outpatient Medications on File Prior to Visit  Medication Sig Dispense Refill   busPIRone (BUSPAR) 15 MG tablet buspirone 15 mg tablet     celecoxib (CELEBREX) 100 MG capsule Take 1 capsule (100 mg total) by mouth 2 (two) times daily. 60 capsule 3   chlorthalidone (HYGROTON) 25 MG tablet TAKE 1 TABLET BY MOUTH DAILY IN THE MORNING     diazepam (VALIUM) 2 MG tablet Take 1 tablet (2 mg total) by mouth every 8 (eight) hours as needed for muscle spasms. 15 tablet 0   ferrous sulfate 325 (65 FE) MG tablet Take by mouth.     FLUoxetine (PROZAC) 20 MG tablet Take 20 mg by mouth daily.     hydrochlorothiazide (HYDRODIURIL) 25 MG tablet Take by mouth.  hydrOXYzine (ATARAX/VISTARIL) 25 MG tablet hydroxyzine HCl 25 mg tablet      naproxen (NAPROSYN) 500 MG tablet Take 1 tablet (500 mg total) by mouth 2 (two) times daily with a meal. 14 tablet 0   prazosin (MINIPRESS) 1 MG capsule prazosin 1 mg capsule     valACYclovir (VALTREX) 500 MG tablet Take 1 tablet (500 mg total) by mouth daily. Can increase to twice a day for 5 days in the event of a recurrence 30 tablet 12   famotidine (PEPCID) 20 MG tablet Take 1 tablet (20 mg total) by mouth daily. 30 tablet 1   oxyCODONE-acetaminophen (PERCOCET/ROXICET) 5-325 MG tablet Take 1 tablet by mouth every 4 (four) hours as needed for severe pain. (Patient not taking: Reported on 08/10/2020) 15 tablet 0   traZODone (DESYREL) 50 MG tablet Take 50 mg by mouth daily. (Patient not taking: Reported on 08/10/2020)     VIORELE 0.15-0.02/0.01 MG (21/5) tablet TAKE 1 TABLET BY MOUTH AT BEDTIME (Patient not taking: Reported on 08/10/2020) 28 tablet 2   No current facility-administered medications on file prior to visit.       Objective:     Vitals:   08/10/20 0805  BP: 116/77  Pulse: 81    Filed Weights   08/10/20 0805  Weight: 155 lb (70.3 kg)              Physical examination General NAD, Conversant  HEENT Atraumatic; Op clear with mmm.  Normo-cephalic. Pupils reactive. Anicteric sclerae  Thyroid/Neck Smooth without nodularity or enlargement. Normal ROM.  Neck Supple.  Skin No rashes, lesions or ulceration. Normal palpated skin turgor. No nodularity.  Breasts: Bilateral mastectomy  Lungs: Clear to auscultation.No rales or wheezes. Normal Respiratory effort, no retractions.  Heart: NSR.  No murmurs or rubs appreciated. No periferal edema  Abdomen: Soft.  Non-tender.  No masses.  No HSM. No hernia  Extremities: Moves all appropriately.  Normal ROM for age. No lymphadenopathy.  Neuro: Oriented to PPT.  Normal mood. Normal affect.     Pelvic:   Vulva: Normal appearance.  No lesions.  Vagina: No lesions or abnormalities noted.  Support: Normal pelvic support.  Urethra No masses  tenderness or scarring.  Meatus Normal size without lesions or prolapse.  Cervix: Normal appearance.  No lesions.  Anus: Normal exam.  No lesions.  Perineum: Normal exam.  No lesions.        Bimanual   Uterus: 14 weeks size non-tender.  Mobile.  AV.  Adnexae: No masses.  Non-tender to palpation.  Cul-de-sac: Negative for abnormality.     Assessment:    No obstetric history on file. 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 Well woman exam with routine gynecological exam   2. Encounter for screening mammogram for malignant neoplasm of breast   3. 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   Plan:            1.  Basic Screening Recommendations The basic screening recommendations for asymptomatic women were discussed with the patient during her visit.  The age-appropriate recommendations were discussed with her and the rational for the tests reviewed.  When I am informed by the patient that another primary care physician has previously obtained the age-appropriate tests and they are up-to-date, only outstanding tests are ordered and referrals given as necessary.  Abnormal results of tests will be discussed with her  when all of her results are completed.  Routine preventative health maintenance measures emphasized: Exercise/Diet/Weight control, Tobacco Warnings, Alcohol/Substance use risks and Stress Management Pap performed -mammogram deferred to oncology as needed (previous history of bilateral mastectomies with current implants) 2.  Restart Seasonique at patient request for cycle control with uterine fibroids 3.  We will try a different pattern of Valtrex to use patient to use twice daily during the expected week of outbreak associated with her menses. Orders Orders Placed This Encounter  Procedures   MM 3D SCREEN BREAST BILATERAL    No orders of the defined types were placed in this encounter.       F/U  Return in about 1 year (around 08/10/2021) for Annual Physical.  Finis Bud, M.D. 08/10/2020 8:38 AM

## 2020-08-10 NOTE — Progress Notes (Signed)
Patient has a recent decline in her energy.

## 2020-08-14 LAB — CYTOLOGY - PAP
Comment: NEGATIVE
Diagnosis: NEGATIVE
High risk HPV: NEGATIVE

## 2020-08-15 ENCOUNTER — Other Ambulatory Visit: Payer: Self-pay

## 2020-08-15 ENCOUNTER — Inpatient Hospital Stay: Payer: Medicaid Other

## 2020-08-15 VITALS — BP 119/80 | HR 70 | Temp 97.2°F | Resp 18

## 2020-08-15 DIAGNOSIS — D509 Iron deficiency anemia, unspecified: Secondary | ICD-10-CM

## 2020-08-15 MED ORDER — IRON SUCROSE 20 MG/ML IV SOLN
200.0000 mg | Freq: Once | INTRAVENOUS | Status: AC
  Administered 2020-08-15: 200 mg via INTRAVENOUS
  Filled 2020-08-15: qty 10

## 2020-08-15 MED ORDER — SODIUM CHLORIDE 0.9 % IV SOLN: 200.0000 mg | Freq: Once | INTRAVENOUS | Status: DC

## 2020-08-15 MED ORDER — SODIUM CHLORIDE 0.9 % IV SOLN
Freq: Once | INTRAVENOUS | Status: AC
  Filled 2020-08-15: qty 250

## 2020-08-15 NOTE — Patient Instructions (Signed)
[facility] [place] [facility]  Discharge Instructions: Thank you for choosing [facility] to provide your oncology and hematology care.  If you have a lab appointment with the Cancer Center, please go directly to the Cancer Center and check in at the registration area.  Wear comfortable clothing and clothing appropriate for easy access to any Portacath or PICC line.   We strive to give you quality time with your provider. You may need to reschedule your appointment if you arrive late (15 or more minutes).  Arriving late affects you and other patients whose appointments are after yours.  Also, if you miss three or more appointments without notifying the office, you may be dismissed from the clinic at the provider's discretion.      For prescription refill requests, have your pharmacy contact our office and allow 72 hours for refills to be completed.    Today you received the following chemotherapy and/or immunotherapy agents venofer       To help prevent nausea and vomiting after your treatment, we encourage you to take your nausea medication as directed.  BELOW ARE SYMPTOMS THAT SHOULD BE REPORTED IMMEDIATELY: *FEVER GREATER THAN 100.4 F (38 C) OR HIGHER *CHILLS OR SWEATING *NAUSEA AND VOMITING THAT IS NOT CONTROLLED WITH YOUR NAUSEA MEDICATION *UNUSUAL SHORTNESS OF BREATH *UNUSUAL BRUISING OR BLEEDING *URINARY PROBLEMS (pain or burning when urinating, or frequent urination) *BOWEL PROBLEMS (unusual diarrhea, constipation, pain near the anus) TENDERNESS IN MOUTH AND THROAT WITH OR WITHOUT PRESENCE OF ULCERS (sore throat, sores in mouth, or a toothache) UNUSUAL RASH, SWELLING OR PAIN  UNUSUAL VAGINAL DISCHARGE OR ITCHING   Items with * indicate a potential emergency and should be followed up as soon as possible or go to the Emergency Department if any problems should occur.  Please show the CHEMOTHERAPY ALERT CARD or IMMUNOTHERAPY ALERT CARD at check-in  to the Emergency Department and triage nurse.  Should you have questions after your visit or need to cancel or reschedule your appointment, please contact [facility] [place] [facility]  [phone] and follow the prompts.  Office hours are 8:00 a.m. to 4:30 p.m. Monday - Friday. Please note that voicemails left after 4:00 p.m. may not be returned until the following business day.  We are closed weekends and major holidays. You have access to a nurse at all times for urgent questions. Please call the main number to the clinic [phone] and follow the prompts.  For any non-urgent questions, you may also contact your provider using MyChart. We now offer e-Visits for anyone 18 and older to request care online for non-urgent symptoms. For details visit [email or web address].   Also download the MyChart app! Go to the app store, search "MyChart", open the app, select [place], and log in with your MyChart username and password.  Due to Covid, a mask is required upon entering the hospital/clinic. If you do not have a mask, one will be given to you upon arrival. For doctor visits, patients may have 1 support person aged 18 or older with them. For treatment visits, patients cannot have anyone with them due to current Covid guidelines and our immunocompromised population.   Iron Sucrose injection What is this medication? IRON SUCROSE (AHY ern SOO krohs) is an iron complex. Iron is used to make healthy red blood cells, which carry oxygen and nutrients throughout the body. This medicine is used to treat iron deficiency anemia in people with chronickidney disease. This medicine may be used for   other purposes; ask your health care provider orpharmacist if you have questions. COMMON BRAND NAME(S): Venofer What should I tell my care team before I take this medication? They need to know if you have any of these conditions: anemia not caused by low iron levels heart disease high levels of  iron in the blood kidney disease liver disease an unusual or allergic reaction to iron, other medicines, foods, dyes, or preservatives pregnant or trying to get pregnant breast-feeding How should I use this medication? This medicine is for infusion into a vein. It is given by a health careprofessional in a hospital or clinic setting. Talk to your pediatrician regarding the use of this medicine in children. While this drug may be prescribed for children as young as 2 years for selectedconditions, precautions do apply. Overdosage: If you think you have taken too much of this medicine contact apoison control center or emergency room at once. NOTE: This medicine is only for you. Do not share this medicine with others. What if I miss a dose? It is important not to miss your dose. Call your doctor or health careprofessional if you are unable to keep an appointment. What may interact with this medication? Do not take this medicine with any of the following medications: deferoxamine dimercaprol other iron products This medicine may also interact with the following medications: chloramphenicol deferasirox This list may not describe all possible interactions. Give your health care provider a list of all the medicines, herbs, non-prescription drugs, or dietary supplements you use. Also tell them if you smoke, drink alcohol, or use illegaldrugs. Some items may interact with your medicine. What should I watch for while using this medication? Visit your doctor or healthcare professional regularly. Tell your doctor or healthcare professional if your symptoms do not start to get better or if theyget worse. You may need blood work done while you are taking this medicine. You may need to follow a special diet. Talk to your doctor. Foods that contain iron include: whole grains/cereals, dried fruits, beans, or peas, leafy greenvegetables, and organ meats (liver, kidney). What side effects may I notice from  receiving this medication? Side effects that you should report to your doctor or health care professionalas soon as possible: allergic reactions like skin rash, itching or hives, swelling of the face, lips, or tongue breathing problems changes in blood pressure cough fast, irregular heartbeat feeling faint or lightheaded, falls fever or chills flushing, sweating, or hot feelings joint or muscle aches/pains seizures swelling of the ankles or feet unusually weak or tired Side effects that usually do not require medical attention (report to yourdoctor or health care professional if they continue or are bothersome): diarrhea feeling achy headache irritation at site where injected nausea, vomiting stomach upset tiredness This list may not describe all possible side effects. Call your doctor for medical advice about side effects. You may report side effects to FDA at1-800-FDA-1088. Where should I keep my medication? This drug is given in a hospital or clinic and will not be stored at home. NOTE: This sheet is a summary. It may not cover all possible information. If you have questions about this medicine, talk to your doctor, pharmacist, orhealth care provider.  2022 Elsevier/Gold Standard (2010-10-05 17:14:35)  

## 2020-08-18 ENCOUNTER — Inpatient Hospital Stay: Payer: Medicaid Other

## 2020-08-18 ENCOUNTER — Other Ambulatory Visit: Payer: Self-pay

## 2020-08-18 VITALS — BP 129/73 | HR 83 | Temp 98.0°F | Resp 18

## 2020-08-18 DIAGNOSIS — D509 Iron deficiency anemia, unspecified: Secondary | ICD-10-CM

## 2020-08-18 MED ORDER — SODIUM CHLORIDE 0.9 % IV SOLN
Freq: Once | INTRAVENOUS | Status: AC
  Filled 2020-08-18: qty 250

## 2020-08-18 MED ORDER — SODIUM CHLORIDE 0.9 % IV SOLN: 200.0000 mg | Freq: Once | INTRAVENOUS | Status: DC

## 2020-08-18 MED ORDER — IRON SUCROSE 20 MG/ML IV SOLN
200.0000 mg | Freq: Once | INTRAVENOUS | Status: AC
  Administered 2020-08-18: 200 mg via INTRAVENOUS
  Filled 2020-08-18: qty 10

## 2020-08-18 NOTE — Patient Instructions (Signed)
[facility] [place] [facility]  Discharge Instructions: Thank you for choosing [facility] to provide your oncology and hematology care.  If you have a lab appointment with the Cancer Center, please go directly to the Cancer Center and check in at the registration area.  Wear comfortable clothing and clothing appropriate for easy access to any Portacath or PICC line.   We strive to give you quality time with your provider. You may need to reschedule your appointment if you arrive late (15 or more minutes).  Arriving late affects you and other patients whose appointments are after yours.  Also, if you miss three or more appointments without notifying the office, you may be dismissed from the clinic at the provider's discretion.      For prescription refill requests, have your pharmacy contact our office and allow 72 hours for refills to be completed.    Today you received venofer   To help prevent nausea and vomiting after your treatment, we encourage you to take your nausea medication as directed.  BELOW ARE SYMPTOMS THAT SHOULD BE REPORTED IMMEDIATELY: *FEVER GREATER THAN 100.4 F (38 C) OR HIGHER *CHILLS OR SWEATING *NAUSEA AND VOMITING THAT IS NOT CONTROLLED WITH YOUR NAUSEA MEDICATION *UNUSUAL SHORTNESS OF BREATH *UNUSUAL BRUISING OR BLEEDING *URINARY PROBLEMS (pain or burning when urinating, or frequent urination) *BOWEL PROBLEMS (unusual diarrhea, constipation, pain near the anus) TENDERNESS IN MOUTH AND THROAT WITH OR WITHOUT PRESENCE OF ULCERS (sore throat, sores in mouth, or a toothache) UNUSUAL RASH, SWELLING OR PAIN  UNUSUAL VAGINAL DISCHARGE OR ITCHING   Items with * indicate a potential emergency and should be followed up as soon as possible or go to the Emergency Department if any problems should occur.  Please show the CHEMOTHERAPY ALERT CARD or IMMUNOTHERAPY ALERT CARD at check-in to the Emergency Department and triage nurse.  Should you  have questions after your visit or need to cancel or reschedule your appointment, please contact [facility] [place] [facility]  [phone] and follow the prompts.  Office hours are 8:00 a.m. to 4:30 p.m. Monday - Friday. Please note that voicemails left after 4:00 p.m. may not be returned until the following business day.  We are closed weekends and major holidays. You have access to a nurse at all times for urgent questions. Please call the main number to the clinic [phone] and follow the prompts.  For any non-urgent questions, you may also contact your provider using MyChart. We now offer e-Visits for anyone 18 and older to request care online for non-urgent symptoms. For details visit [email or web address].   Also download the MyChart app! Go to the app store, search "MyChart", open the app, select [place], and log in with your MyChart username and password.  Due to Covid, a mask is required upon entering the hospital/clinic. If you do not have a mask, one will be given to you upon arrival. For doctor visits, patients may have 1 support person aged 18 or older with them. For treatment visits, patients cannot have anyone with them due to current Covid guidelines and our immunocompromised population.  

## 2020-08-22 ENCOUNTER — Inpatient Hospital Stay: Payer: Medicaid Other

## 2020-08-22 ENCOUNTER — Other Ambulatory Visit: Payer: Self-pay

## 2020-08-22 VITALS — BP 113/76 | HR 99 | Temp 98.0°F | Resp 18

## 2020-08-22 DIAGNOSIS — D509 Iron deficiency anemia, unspecified: Secondary | ICD-10-CM | POA: Diagnosis not present

## 2020-08-22 MED ORDER — SODIUM CHLORIDE 0.9 % IV SOLN: 200.0000 mg | Freq: Once | INTRAVENOUS | Status: DC

## 2020-08-22 MED ORDER — IRON SUCROSE 20 MG/ML IV SOLN
200.0000 mg | Freq: Once | INTRAVENOUS | Status: AC
  Administered 2020-08-22: 200 mg via INTRAVENOUS
  Filled 2020-08-22: qty 10

## 2020-08-22 MED ORDER — SODIUM CHLORIDE 0.9 % IV SOLN
Freq: Once | INTRAVENOUS | Status: AC
  Filled 2020-08-22: qty 250

## 2020-08-22 NOTE — Patient Instructions (Signed)

## 2020-08-25 ENCOUNTER — Inpatient Hospital Stay: Payer: Medicaid Other

## 2020-08-25 ENCOUNTER — Other Ambulatory Visit: Payer: Self-pay

## 2020-08-25 VITALS — BP 128/77 | HR 91 | Temp 97.2°F | Resp 18

## 2020-08-25 DIAGNOSIS — D509 Iron deficiency anemia, unspecified: Secondary | ICD-10-CM | POA: Diagnosis not present

## 2020-08-25 MED ORDER — IRON SUCROSE 20 MG/ML IV SOLN
200.0000 mg | Freq: Once | INTRAVENOUS | Status: AC
  Administered 2020-08-25: 200 mg via INTRAVENOUS
  Filled 2020-08-25: qty 10

## 2020-08-25 MED ORDER — SODIUM CHLORIDE 0.9 % IV SOLN: 200.0000 mg | Freq: Once | INTRAVENOUS | Status: DC

## 2020-08-25 MED ORDER — SODIUM CHLORIDE 0.9 % IV SOLN
Freq: Once | INTRAVENOUS | Status: AC
  Filled 2020-08-25: qty 250

## 2020-08-25 NOTE — Patient Instructions (Signed)
[facility] [facility]  Discharge Instructions: Thank you for choosing [facility] to provide your oncology and hematology care.  If you have a lab appointment with the Cancer Center, please go directly to the Cancer Center and check in at the registration area.  Wear comfortable clothing and clothing appropriate for easy access to any Portacath or PICC line.   We strive to give you quality time with your provider. You may need to reschedule your appointment if you arrive late (15 or more minutes).  Arriving late affects you and other patients whose appointments are after yours.  Also, if you miss three or more appointments without notifying the office, you may be dismissed from the clinic at the provider's discretion.      For prescription refill requests, have your pharmacy contact our office and allow 72 hours for refills to be completed.    Today you received the following chemotherapy and/or immunotherapy agents VENOFER      To help prevent nausea and vomiting after your treatment, we encourage you to take your nausea medication as directed.  BELOW ARE SYMPTOMS THAT SHOULD BE REPORTED IMMEDIATELY: *FEVER GREATER THAN 100.4 F (38 C) OR HIGHER *CHILLS OR SWEATING *NAUSEA AND VOMITING THAT IS NOT CONTROLLED WITH YOUR NAUSEA MEDICATION *UNUSUAL SHORTNESS OF BREATH *UNUSUAL BRUISING OR BLEEDING *URINARY PROBLEMS (pain or burning when urinating, or frequent urination) *BOWEL PROBLEMS (unusual diarrhea, constipation, pain near the anus) TENDERNESS IN MOUTH AND THROAT WITH OR WITHOUT PRESENCE OF ULCERS (sore throat, sores in mouth, or a toothache) UNUSUAL RASH, SWELLING OR PAIN  UNUSUAL VAGINAL DISCHARGE OR ITCHING   Items with * indicate a potential emergency and should be followed up as soon as possible or go to the Emergency Department if any problems should occur.  Please show the CHEMOTHERAPY ALERT CARD or IMMUNOTHERAPY ALERT CARD at check-in to  the Emergency Department and triage nurse.  Should you have questions after your visit or need to cancel or reschedule your appointment, please contact [facility] [place] [facility]  [phone] and follow the prompts.  Office hours are 8:00 a.m. to 4:30 p.m. Monday - Friday. Please note that voicemails left after 4:00 p.m. may not be returned until the following business day.  We are closed weekends and major holidays. You have access to a nurse at all times for urgent questions. Please call the main number to the clinic [phone] and follow the prompts.  For any non-urgent questions, you may also contact your provider using MyChart. We now offer e-Visits for anyone 18 and older to request care online for non-urgent symptoms. For details visit [email or web address].   Also download the MyChart app! Go to the app store, search "MyChart", open the app, select [place], and log in with your MyChart username and password.  Due to Covid, a mask is required upon entering the hospital/clinic. If you do not have a mask, one will be given to you upon arrival. For doctor visits, patients may have 1 support person aged 18 or older with them. For treatment visits, patients cannot have anyone with them due to current Covid guidelines and our immunocompromised population.   Iron Sucrose injection What is this medication? IRON SUCROSE (AHY ern SOO krohs) is an iron complex. Iron is used to make healthy red blood cells, which carry oxygen and nutrients throughout the body. This medicine is used to treat iron deficiency anemia in people with chronickidney disease. This medicine may be used for other   purposes; ask your health care provider orpharmacist if you have questions. COMMON BRAND NAME(S): Venofer What should I tell my care team before I take this medication? They need to know if you have any of these conditions: anemia not caused by low iron levels heart disease high levels of  iron in the blood kidney disease liver disease an unusual or allergic reaction to iron, other medicines, foods, dyes, or preservatives pregnant or trying to get pregnant breast-feeding How should I use this medication? This medicine is for infusion into a vein. It is given by a health careprofessional in a hospital or clinic setting. Talk to your pediatrician regarding the use of this medicine in children. While this drug may be prescribed for children as young as 2 years for selectedconditions, precautions do apply. Overdosage: If you think you have taken too much of this medicine contact apoison control center or emergency room at once. NOTE: This medicine is only for you. Do not share this medicine with others. What if I miss a dose? It is important not to miss your dose. Call your doctor or health careprofessional if you are unable to keep an appointment. What may interact with this medication? Do not take this medicine with any of the following medications: deferoxamine dimercaprol other iron products This medicine may also interact with the following medications: chloramphenicol deferasirox This list may not describe all possible interactions. Give your health care provider a list of all the medicines, herbs, non-prescription drugs, or dietary supplements you use. Also tell them if you smoke, drink alcohol, or use illegaldrugs. Some items may interact with your medicine. What should I watch for while using this medication? Visit your doctor or healthcare professional regularly. Tell your doctor or healthcare professional if your symptoms do not start to get better or if theyget worse. You may need blood work done while you are taking this medicine. You may need to follow a special diet. Talk to your doctor. Foods that contain iron include: whole grains/cereals, dried fruits, beans, or peas, leafy greenvegetables, and organ meats (liver, kidney). What side effects may I notice from  receiving this medication? Side effects that you should report to your doctor or health care professionalas soon as possible: allergic reactions like skin rash, itching or hives, swelling of the face, lips, or tongue breathing problems changes in blood pressure cough fast, irregular heartbeat feeling faint or lightheaded, falls fever or chills flushing, sweating, or hot feelings joint or muscle aches/pains seizures swelling of the ankles or feet unusually weak or tired Side effects that usually do not require medical attention (report to yourdoctor or health care professional if they continue or are bothersome): diarrhea feeling achy headache irritation at site where injected nausea, vomiting stomach upset tiredness This list may not describe all possible side effects. Call your doctor for medical advice about side effects. You may report side effects to FDA at1-800-FDA-1088. Where should I keep my medication? This drug is given in a hospital or clinic and will not be stored at home. NOTE: This sheet is a summary. It may not cover all possible information. If you have questions about this medicine, talk to your doctor, pharmacist, orhealth care provider.  2022 Elsevier/Gold Standard (2010-10-05 17:14:35)  

## 2020-08-29 ENCOUNTER — Inpatient Hospital Stay: Payer: Medicaid Other

## 2020-08-29 ENCOUNTER — Other Ambulatory Visit: Payer: Self-pay

## 2020-08-29 VITALS — BP 128/74 | HR 96 | Temp 96.9°F | Resp 16

## 2020-08-29 DIAGNOSIS — D509 Iron deficiency anemia, unspecified: Secondary | ICD-10-CM | POA: Diagnosis not present

## 2020-08-29 MED ORDER — HEPARIN SOD (PORK) LOCK FLUSH 100 UNIT/ML IV SOLN
500.0000 [IU] | Freq: Once | INTRAVENOUS | Status: DC | PRN
  Filled 2020-08-29: qty 5

## 2020-08-29 MED ORDER — IRON SUCROSE 20 MG/ML IV SOLN
200.0000 mg | Freq: Once | INTRAVENOUS | Status: AC
  Administered 2020-08-29: 200 mg via INTRAVENOUS
  Filled 2020-08-29: qty 10

## 2020-08-29 MED ORDER — HEPARIN SOD (PORK) LOCK FLUSH 100 UNIT/ML IV SOLN
INTRAVENOUS | Status: AC
  Filled 2020-08-29: qty 5

## 2020-08-29 MED ORDER — SODIUM CHLORIDE 0.9 % IV SOLN
Freq: Once | INTRAVENOUS | Status: AC
  Filled 2020-08-29: qty 250

## 2020-08-29 MED ORDER — SODIUM CHLORIDE 0.9 % IV SOLN: 200.0000 mg | Freq: Once | INTRAVENOUS | Status: DC

## 2020-12-08 ENCOUNTER — Inpatient Hospital Stay: Payer: Medicaid Other | Attending: Nurse Practitioner

## 2020-12-12 ENCOUNTER — Inpatient Hospital Stay (HOSPITAL_BASED_OUTPATIENT_CLINIC_OR_DEPARTMENT_OTHER): Payer: Medicaid Other | Admitting: Nurse Practitioner

## 2020-12-12 ENCOUNTER — Encounter: Payer: Self-pay | Admitting: Nurse Practitioner

## 2020-12-12 ENCOUNTER — Telehealth: Payer: Self-pay

## 2020-12-12 DIAGNOSIS — D509 Iron deficiency anemia, unspecified: Secondary | ICD-10-CM

## 2020-12-12 NOTE — Telephone Encounter (Signed)
Attempted to call patient for telephone appoinment today to review meds/chart, no answer. Left voicemail.

## 2020-12-12 NOTE — Progress Notes (Signed)
Patient didn't answer for virtual visit. Scheduling message sent to have labs (cbc, ferritin, iron studies) then virtual visit day to week later.

## 2021-01-19 ENCOUNTER — Ambulatory Visit (INDEPENDENT_AMBULATORY_CARE_PROVIDER_SITE_OTHER): Payer: Medicaid Other | Admitting: Obstetrics and Gynecology

## 2021-01-19 ENCOUNTER — Encounter: Payer: Self-pay | Admitting: Obstetrics and Gynecology

## 2021-01-19 ENCOUNTER — Other Ambulatory Visit: Payer: Self-pay

## 2021-01-19 VITALS — BP 131/78 | HR 79 | Resp 16 | Ht 60.0 in | Wt 151.5 lb

## 2021-01-19 DIAGNOSIS — N921 Excessive and frequent menstruation with irregular cycle: Secondary | ICD-10-CM | POA: Diagnosis not present

## 2021-01-19 DIAGNOSIS — R102 Pelvic and perineal pain: Secondary | ICD-10-CM

## 2021-01-19 LAB — POCT URINALYSIS DIPSTICK OB
Bilirubin, UA: NEGATIVE
Glucose, UA: NEGATIVE
Ketones, UA: NEGATIVE
Leukocytes, UA: NEGATIVE
Nitrite, UA: NEGATIVE
POC,PROTEIN,UA: NEGATIVE
Spec Grav, UA: 1.01 (ref 1.010–1.025)
Urobilinogen, UA: 0.2 E.U./dL
pH, UA: 8 (ref 5.0–8.0)

## 2021-01-19 NOTE — Progress Notes (Signed)
HPI:      [name] is a 46 y.o. No obstetric history on file. who LMP was No LMP recorded.  Subjective:   She presents today stating that she is having trouble with her insurance company and that she cannot always get her OCPs in a timely manner.  This causes her to miss some packs of pills.  She also states that even though she was taking a 76-month OCPs she still has menstrual periods every month.  She would like to consider other options for birth control and cycle control.    Hx: The following portions of the patient's history were reviewed and updated as appropriate:             She  has a past medical history of Anemia, Anxiety, Breast CA ([place]), Breast cancer ([place]), Depression, GERD (gastroesophageal reflux disease), and Sleep difficulties. She does not have any pertinent problems on file. She  has a past surgical history that includes Cholecystectomy; Cesarean section; Mastectomy (2010); Bilateral total mastectomy with axillary lymph node dissection (2010); and Tubal ligation. Her family history includes Prostate cancer in her maternal grandfather. She  reports that she quit smoking about 14 years ago. Her smoking use included cigarettes. She has never used smokeless tobacco. She reports current alcohol use. She reports that she does not currently use drugs. Frequency: 1.00 time per week. She has a current medication list which includes the following prescription(s): baclofen, buspirone, chlorthalidone, diazepam, ferrous sulfate, fluoxetine, fluoxetine, hydrochlorothiazide, hydroxyzine, levonorgestrel-ethinyl estradiol, meloxicam, naproxen, oxycodone-acetaminophen, prazosin, trazodone, valacyclovir, celecoxib, cyclobenzaprine, famotidine, and viorele. She is allergic to tramadol.       Review of Systems:  Review of Systems  Constitutional: Denied constitutional symptoms, night sweats, recent illness, fatigue, fever, insomnia and weight loss.  Eyes: Denied eye symptoms, eye  pain, photophobia, vision change and visual disturbance.  Ears/Nose/Throat/Neck: Denied ear, nose, throat or neck symptoms, hearing loss, nasal discharge, sinus congestion and sore throat.  Cardiovascular: Denied cardiovascular symptoms, arrhythmia, chest pain/pressure, edema, exercise intolerance, orthopnea and palpitations.  Respiratory: Denied pulmonary symptoms, asthma, pleuritic pain, productive sputum, cough, dyspnea and wheezing.  Gastrointestinal: Denied, gastro-esophageal reflux, melena, nausea and vomiting.  Genitourinary: See HPI for additional information.  Musculoskeletal: Denied musculoskeletal symptoms, stiffness, swelling, muscle weakness and myalgia.  Dermatologic: Denied dermatology symptoms, rash and scar.  Neurologic: Denied neurology symptoms, dizziness, headache, neck pain and syncope.  Psychiatric: Denied psychiatric symptoms, anxiety and depression.  Endocrine: Denied endocrine symptoms including hot flashes and night sweats.   Meds:   Current Outpatient Medications on File Prior to Visit  Medication Sig Dispense Refill   baclofen (LIORESAL) 10 MG tablet Take 10 mg by mouth 2 (two) times daily.     busPIRone (BUSPAR) 15 MG tablet buspirone 15 mg tablet     chlorthalidone (HYGROTON) 25 MG tablet TAKE 1 TABLET BY MOUTH DAILY IN THE MORNING     diazepam (VALIUM) 2 MG tablet Take 1 tablet (2 mg total) by mouth every 8 (eight) hours as needed for muscle spasms. 15 tablet 0   ferrous sulfate 325 (65 FE) MG tablet Take by mouth.     FLUoxetine (PROZAC) 20 MG capsule Take 60 mg by mouth every morning.     FLUoxetine (PROZAC) 20 MG tablet Take 20 mg by mouth daily.     hydrochlorothiazide (HYDRODIURIL) 25 MG tablet Take by mouth.     hydrOXYzine (ATARAX/VISTARIL) 25 MG tablet hydroxyzine HCl 25 mg tablet     Levonorgestrel-Ethinyl Estradiol (AMETHIA) 0.15-0.03 &  0.01 MG tablet Take 1 tablet by mouth at bedtime. 84 tablet 3   meloxicam (MOBIC) 15 MG tablet meloxicam 15 mg  tablet  Take 1 tablet every day by oral route.     naproxen (NAPROSYN) 500 MG tablet Take 1 tablet (500 mg total) by mouth 2 (two) times daily with a meal. 14 tablet 0   oxyCODONE-acetaminophen (PERCOCET/ROXICET) 5-325 MG tablet Take 1 tablet by mouth every 4 (four) hours as needed for severe pain. 15 tablet 0   prazosin (MINIPRESS) 1 MG capsule prazosin 1 mg capsule     traZODone (DESYREL) 50 MG tablet Take 50 mg by mouth daily.     valACYclovir (VALTREX) 500 MG tablet Take 1 tablet (500 mg total) by mouth daily. Can increase to twice a day for 5 days in the event of a recurrence 30 tablet 12   celecoxib (CELEBREX) 100 MG capsule Take 1 capsule (100 mg total) by mouth 2 (two) times daily. (Patient not taking: Reported on 01/19/2021) 60 capsule 3   cyclobenzaprine (FLEXERIL) 10 MG tablet cyclobenzaprine 10 mg tablet  Take 1 tablet twice a day by oral route. (Patient not taking: Reported on 01/19/2021)     famotidine (PEPCID) 20 MG tablet Take 1 tablet (20 mg total) by mouth daily. 30 tablet 1   VIORELE 0.15-0.02/0.01 MG (21/5) tablet TAKE 1 TABLET BY MOUTH AT BEDTIME 28 tablet 2   No current facility-administered medications on file prior to visit.      Objective:     Vitals:   01/19/21 1305  BP: 131/78  Pulse: 79  Resp: 16   Filed Weights   01/19/21 1305  Weight: 151 lb 8 oz (68.7 kg)                        Assessment:    No obstetric history on file. 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 Pelvic pain   2. Breakthrough bleeding on OCPs     Patient has known uterine fibroids  Cycle control not ideal on OCPs-patient considering other methods.   Plan:            1.  Birth Control I discussed multiple birth control options and methods with the patient.  The risks and benefits of each were reviewed. I recommended IUD for both birth control and cycle control especially in light of uterine fibroids.  Patient would like to try this method of birth control cycle control.  Orders Orders Placed This Encounter  Procedures   POC Urinalysis Dipstick OB    No orders of the defined types were placed in this encounter.     F/U  Return in 13 days (on 02/01/2021). I spent 21 minutes involved in the care of this patient preparing to see the patient by obtaining and reviewing her medical history (including labs, imaging tests and prior procedures), documenting clinical information in  the electronic health record (EHR), counseling and coordinating care plans, writing and sending prescriptions, ordering tests or procedures and in direct communicating with the patient and medical staff discussing pertinent items from her history and physical exam.  Finis Bud, M.D. 01/19/2021 1:34 PM

## 2021-01-21 LAB — URINE CULTURE

## 2021-02-01 ENCOUNTER — Ambulatory Visit: Payer: Medicaid Other | Admitting: Podiatry

## 2021-02-01 ENCOUNTER — Encounter: Payer: Self-pay | Admitting: Obstetrics and Gynecology

## 2021-02-01 ENCOUNTER — Ambulatory Visit (INDEPENDENT_AMBULATORY_CARE_PROVIDER_SITE_OTHER): Payer: Medicaid Other | Admitting: Obstetrics and Gynecology

## 2021-02-01 ENCOUNTER — Other Ambulatory Visit: Payer: Self-pay

## 2021-02-01 VITALS — BP 127/83 | HR 81 | Resp 16 | Ht 60.0 in | Wt 148.5 lb

## 2021-02-01 DIAGNOSIS — Z3009 Encounter for other general counseling and advice on contraception: Secondary | ICD-10-CM

## 2021-02-01 DIAGNOSIS — Z30430 Encounter for insertion of intrauterine contraceptive device: Secondary | ICD-10-CM

## 2021-02-01 MED ORDER — MEDROXYPROGESTERONE ACETATE 150 MG/ML IM SUSP: 150.0000 mg | INTRAMUSCULAR | 1 refills | Status: AC

## 2021-02-01 NOTE — Progress Notes (Signed)
HPI:      [name] is a 46 y.o. [id number] who LMP was Patient's last menstrual period was 01/30/2021.  Subjective:   She presents today for IUD insertion for cycle control.  Patient has very painful menstrual periods.  She had breakthrough bleeding every month despite taking Seasonique.    Hx: The following portions of the patient's history were reviewed and updated as appropriate:             She  has a past medical history of Anemia, Anxiety, Breast CA ([place]), Breast cancer ([place]), Depression, GERD (gastroesophageal reflux disease), and Sleep difficulties. She does not have any pertinent problems on file. She  has a past surgical history that includes Cholecystectomy; Cesarean section; Mastectomy (2010); Bilateral total mastectomy with axillary lymph node dissection (2010); and Tubal ligation. Her family history includes Prostate cancer in her maternal grandfather. She  reports that she quit smoking about 14 years ago. Her smoking use included cigarettes. She has never used smokeless tobacco. She reports current alcohol use. She reports that she does not currently use drugs. Frequency: 1.00 time per week. She has a current medication list which includes the following prescription(s): baclofen, buspirone, celecoxib, chlorthalidone, cyclobenzaprine, diazepam, ferrous sulfate, fluoxetine, fluoxetine, hydrochlorothiazide, hydroxyzine, medroxyprogesterone, meloxicam, naproxen, oxycodone-acetaminophen, prazosin, trazodone, valacyclovir, viorele, and famotidine. She is allergic to tramadol.       Review of Systems:  Review of Systems  Constitutional: Denied constitutional symptoms, night sweats, recent illness, fatigue, fever, insomnia and weight loss.  Eyes: Denied eye symptoms, eye pain, photophobia, vision change and visual disturbance.  Ears/Nose/Throat/Neck: Denied ear, nose, throat or neck symptoms, hearing loss, nasal discharge, sinus congestion and sore throat.  Cardiovascular:  Denied cardiovascular symptoms, arrhythmia, chest pain/pressure, edema, exercise intolerance, orthopnea and palpitations.  Respiratory: Denied pulmonary symptoms, asthma, pleuritic pain, productive sputum, cough, dyspnea and wheezing.  Gastrointestinal: Denied, gastro-esophageal reflux, melena, nausea and vomiting.  Genitourinary: Denied genitourinary symptoms including symptomatic vaginal discharge, pelvic relaxation issues, and urinary complaints.  Musculoskeletal: Denied musculoskeletal symptoms, stiffness, swelling, muscle weakness and myalgia.  Dermatologic: Denied dermatology symptoms, rash and scar.  Neurologic: Denied neurology symptoms, dizziness, headache, neck pain and syncope.  Psychiatric: Denied psychiatric symptoms, anxiety and depression.  Endocrine: Denied endocrine symptoms including hot flashes and night sweats.   Meds:   Current Outpatient Medications on File Prior to Visit  Medication Sig Dispense Refill   baclofen (LIORESAL) 10 MG tablet Take 10 mg by mouth 2 (two) times daily.     busPIRone (BUSPAR) 15 MG tablet buspirone 15 mg tablet     celecoxib (CELEBREX) 100 MG capsule Take 1 capsule (100 mg total) by mouth 2 (two) times daily. 60 capsule 3   chlorthalidone (HYGROTON) 25 MG tablet TAKE 1 TABLET BY MOUTH DAILY IN THE MORNING     cyclobenzaprine (FLEXERIL) 10 MG tablet      diazepam (VALIUM) 2 MG tablet Take 1 tablet (2 mg total) by mouth every 8 (eight) hours as needed for muscle spasms. 15 tablet 0   ferrous sulfate 325 (65 FE) MG tablet Take by mouth.     FLUoxetine (PROZAC) 20 MG capsule Take 60 mg by mouth every morning.     FLUoxetine (PROZAC) 20 MG tablet Take 20 mg by mouth daily.     hydrochlorothiazide (HYDRODIURIL) 25 MG tablet Take by mouth.     hydrOXYzine (ATARAX/VISTARIL) 25 MG tablet hydroxyzine HCl 25 mg tablet     meloxicam (MOBIC) 15 MG tablet meloxicam 15 mg tablet  Take 1 tablet every day by oral route.     naproxen (NAPROSYN) 500 MG tablet  Take 1 tablet (500 mg total) by mouth 2 (two) times daily with a meal. 14 tablet 0   oxyCODONE-acetaminophen (PERCOCET/ROXICET) 5-325 MG tablet Take 1 tablet by mouth every 4 (four) hours as needed for severe pain. 15 tablet 0   prazosin (MINIPRESS) 1 MG capsule prazosin 1 mg capsule     traZODone (DESYREL) 50 MG tablet Take 50 mg by mouth daily.     valACYclovir (VALTREX) 500 MG tablet Take 1 tablet (500 mg total) by mouth daily. Can increase to twice a day for 5 days in the event of a recurrence 30 tablet 12   VIORELE 0.15-0.02/0.01 MG (21/5) tablet TAKE 1 TABLET BY MOUTH AT BEDTIME 28 tablet 2   famotidine (PEPCID) 20 MG tablet Take 1 tablet (20 mg total) by mouth daily. 30 tablet 1   No current facility-administered medications on file prior to visit.      Objective:     Vitals:   02/01/21 0844  BP: 127/83  Pulse: 81  Resp: 16   Filed Weights   02/01/21 0844  Weight: 148 lb 8 oz (67.4 kg)    IUD Speculum was placed-cervix cleansed.  Significant cervical stenosis noted.  Using a very small dilator I was able to identify the endocervical canal.  Patient found even this small dilation to be significantly painful.  She was unwilling to continue the procedure because of significant pain from cervical dilation.  Procedure stopped at patient request. 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 Encounter for insertion of mirena IUD   2. Birth control counseling     Failed insertion   Plan:            1.  We have discussed other forms of birth control/cycle control.  It is likely that her significant menstrual cramping has something to do with her very stenotic cervical os.  The option of MAC anesthesia for IUD placement was discussed.  Patient would like to consider this option but in the meanwhile would like to begin Depo-Provera. Risk benefits of Depo discussed. Prescription given-patient to return for injection today. Orders No orders of the defined types were placed in this encounter.    Meds ordered this encounter  Medications   medroxyPROGESTERone (DEPO-PROVERA) 150 MG/ML injection    Sig: Inject 1 mL (150 mg total) into the muscle every 3 (three) months.    Dispense:  1 mL    Refill:  1      F/U  Return in about 3 months (around 05/02/2021). I spent 22 minutes involved in the care of this patient preparing to see the patient by obtaining and reviewing her medical history (  including labs, imaging tests and prior procedures), documenting clinical information in the electronic health record (EHR), counseling and coordinating care plans, writing and sending prescriptions, ordering tests or procedures and in direct communicating with the patient and medical staff discussing pertinent items from her history and physical exam.  Finis Bud, M.D. 02/01/2021 9:31 AM

## 2021-02-02 ENCOUNTER — Encounter: Payer: Medicaid Other | Admitting: Obstetrics and Gynecology

## 2021-02-06 ENCOUNTER — Ambulatory Visit (INDEPENDENT_AMBULATORY_CARE_PROVIDER_SITE_OTHER): Payer: Medicaid Other | Admitting: Obstetrics and Gynecology

## 2021-02-06 ENCOUNTER — Other Ambulatory Visit: Payer: Self-pay

## 2021-02-06 DIAGNOSIS — Z3042 Encounter for surveillance of injectable contraceptive: Secondary | ICD-10-CM

## 2021-02-06 MED ORDER — MEDROXYPROGESTERONE ACETATE 150 MG/ML IM SUSP
150.0000 mg | Freq: Once | INTRAMUSCULAR | Status: AC
  Administered 2021-02-06: 150 mg via INTRAMUSCULAR

## 2021-02-06 NOTE — Progress Notes (Signed)
Date last pap: 08/10/2020. Last Depo-Provera: First Depo on 02/07/2020. Side Effects if any: None. Serum HCG indicated? N/A tubal ligation. Depo-Provera 150 mg IM given by: Cristy Folks, CMA. Next appointment due: April 17- May 08, 2021.

## 2021-02-13 ENCOUNTER — Ambulatory Visit: Payer: Medicaid Other | Admitting: Podiatry

## 2021-02-13 ENCOUNTER — Encounter: Payer: Self-pay | Admitting: Oncology

## 2021-02-22 ENCOUNTER — Encounter: Payer: Self-pay | Admitting: Podiatry

## 2021-02-22 ENCOUNTER — Other Ambulatory Visit: Payer: Self-pay

## 2021-02-22 ENCOUNTER — Ambulatory Visit (INDEPENDENT_AMBULATORY_CARE_PROVIDER_SITE_OTHER): Payer: Medicaid Other | Admitting: Podiatry

## 2021-02-22 DIAGNOSIS — L600 Ingrowing nail: Secondary | ICD-10-CM | POA: Diagnosis not present

## 2021-02-22 DIAGNOSIS — R231 Pallor: Secondary | ICD-10-CM

## 2021-02-22 NOTE — Progress Notes (Signed)
°  Subjective:  Patient ID: Audrey Rowe, female    DOB: 10/17/1975,  MRN: [id number]  Chief Complaint  Patient presents with   Foot Pain    "My feet are turning black and they hurt around my ankles into my legs and also into my big toes"    46 y.o. female presents with the above complaint. History confirmed with patient.  She has had discoloration of both feet that have been worsening over the last 2 months.  Occasionally is tender around the ankles and into the arches and lower legs.  She also has ingrown toenails that are painful  Objective:  Physical Exam: warm, good capillary refill, no trophic changes or ulcerative lesions, normal DP and PT pulses, normal sensory exam, and bilateral hallux ingrowing nails no paronychia noted she has livedo reticularis bilateral throughout the foot and ankle and lower leg, no ulceration. Assessment:   1. Idiopathic or primary livedo reticularis   2. Ingrowing left great toenail   3. Ingrowing right great toenail      Plan:  Patient was evaluated and treated and all questions answered.  I discussed with her that she has livedo reticularis and I am not sure why this would be.  I do think it be important to evaluate lab work which I ordered for her and have sent a referral for her to rheumatology as well as dermatology.  I do not think there is a primary source or issue in the foot and ankle that is causing this  She does have a Current toenails and we discussed treatment of these including permanent partial matricectomy and avulsion.  She will return to see me as needed when she is ready for this.  I advised her that I would prefer to have a diagnosis and treatment plan for what is causing the livedo reticularis prior to doing this in the event there is anything that could be causing significant wound healing issues such as vasculitis or lupus  Return if symptoms worsen or fail to improve, or for ingrown procedure when ready.

## 2021-03-03 NOTE — Progress Notes (Addendum)
Patient has appt with Dr. Nehemiah Massed at [facility] on 05/01/2021 @ 3:30  Per [facility], Patient had appt on 03/09/21 and was a no show

## 2021-03-04 LAB — CBC WITH DIFFERENTIAL/PLATELET
Basophils Absolute: 0.1 10*3/uL (ref 0.0–0.2)
Basos: 2 %
EOS (ABSOLUTE): 0.1 10*3/uL (ref 0.0–0.4)
Eos: 2 %
Hematocrit: 38.9 % (ref 34.0–46.6)
Hemoglobin: 13.3 g/dL (ref 11.1–15.9)
Immature Grans (Abs): 0 10*3/uL (ref 0.0–0.1)
Immature Granulocytes: 0 %
Lymphocytes Absolute: 1.6 10*3/uL (ref 0.7–3.1)
Lymphs: 30 %
MCH: 33.8 pg — ABNORMAL HIGH (ref 26.6–33.0)
MCHC: 34.2 g/dL (ref 31.5–35.7)
MCV: 99 fL — ABNORMAL HIGH (ref 79–97)
Monocytes Absolute: 0.5 10*3/uL (ref 0.1–0.9)
Monocytes: 8 %
Neutrophils Absolute: 3.2 10*3/uL (ref 1.4–7.0)
Neutrophils: 58 %
Platelets: 151 10*3/uL (ref 150–450)
RBC: 3.94 x10E6/uL (ref 3.77–5.28)
RDW: 12.8 % (ref 11.7–15.4)
WBC: 5.5 10*3/uL (ref 3.4–10.8)

## 2021-03-04 LAB — ARTHRITIS PANEL
Anti Nuclear Antibody (ANA): NEGATIVE
Rheumatoid fact SerPl-aCnc: 10.6 IU/mL (ref <14.0)
Sed Rate: 19 mm/hr (ref 0–32)
Uric Acid: 3.6 mg/dL (ref 2.6–6.2)

## 2021-03-04 LAB — ANA,IFA RA DIAG PNL W/RFLX TIT/PATN
ANA Titer 1: NEGATIVE
Cyclic Citrullin Peptide Ab: 4 units (ref 0–19)

## 2021-03-04 LAB — C-REACTIVE PROTEIN: CRP: <1 mg/L (ref 0–10)

## 2021-04-12 IMAGING — CR DG CHEST 2V
1 series · 2 of 2 positions shown · non-contrast
Comparison: 08/03/2018

CLINICAL DATA: Cough

EXAM:
CHEST - 2 VIEW

[Series 1: w chest pa · 0.14mm/px · 2 of 2 slices shown]
[im 1/2]
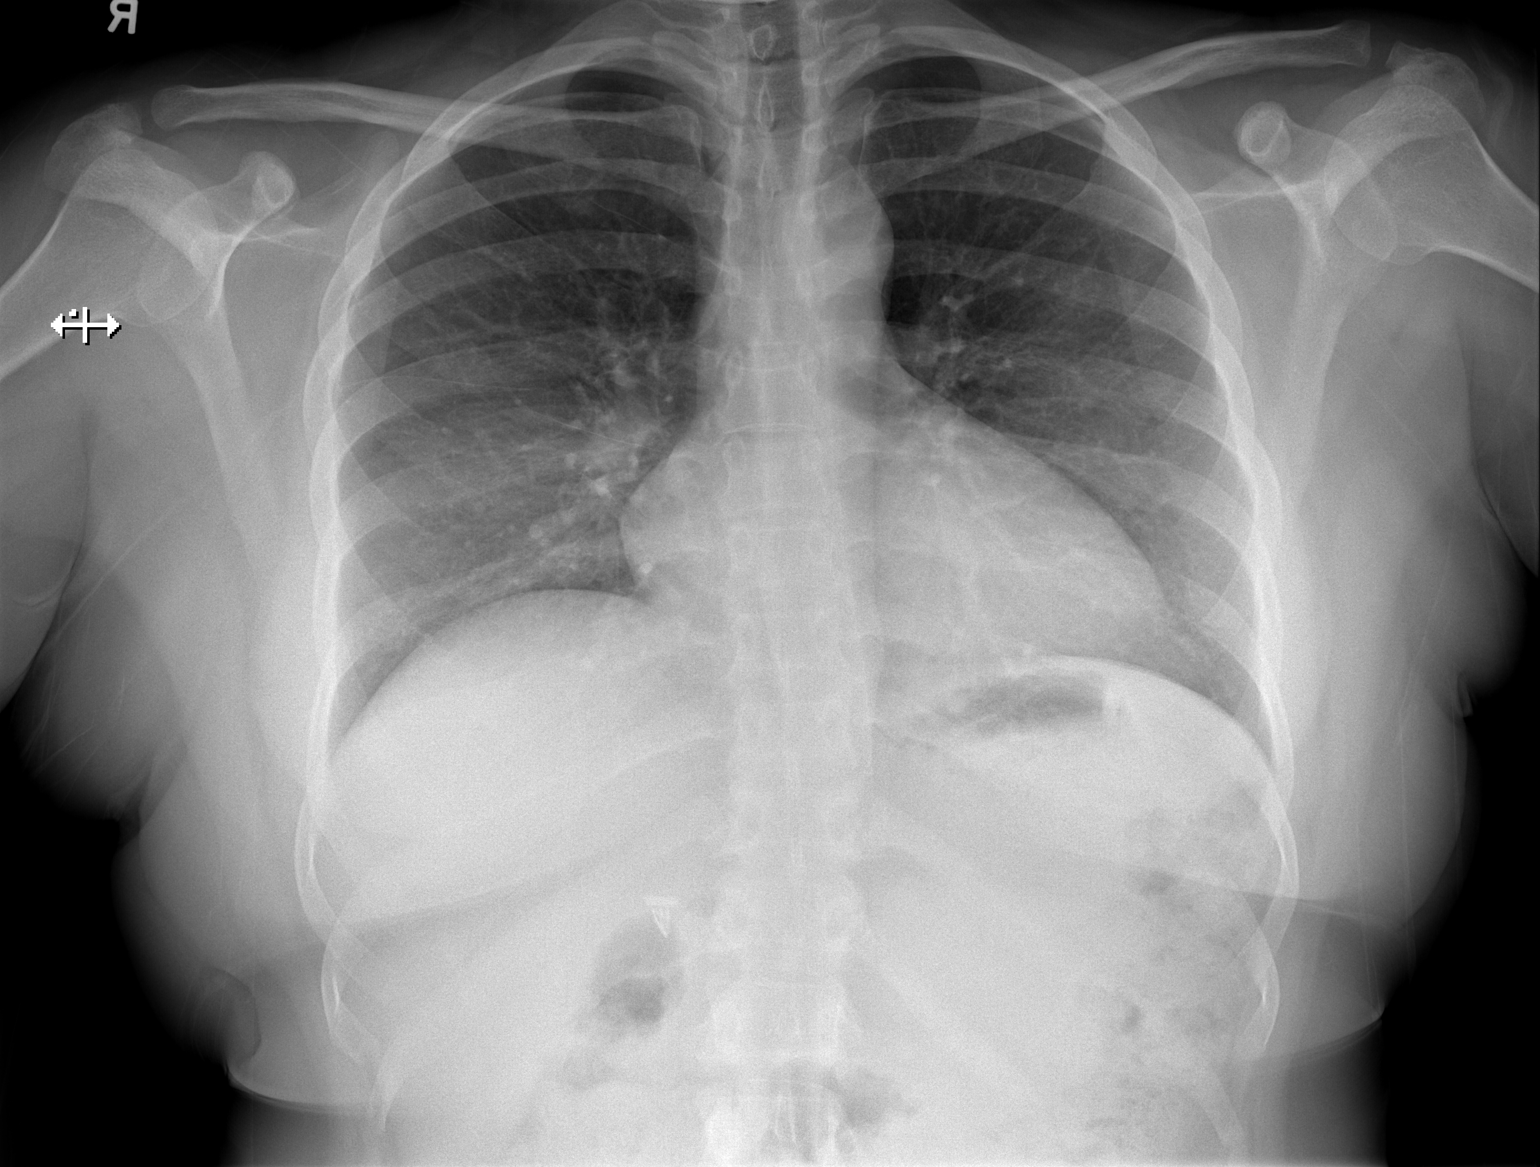
[im 2/2]
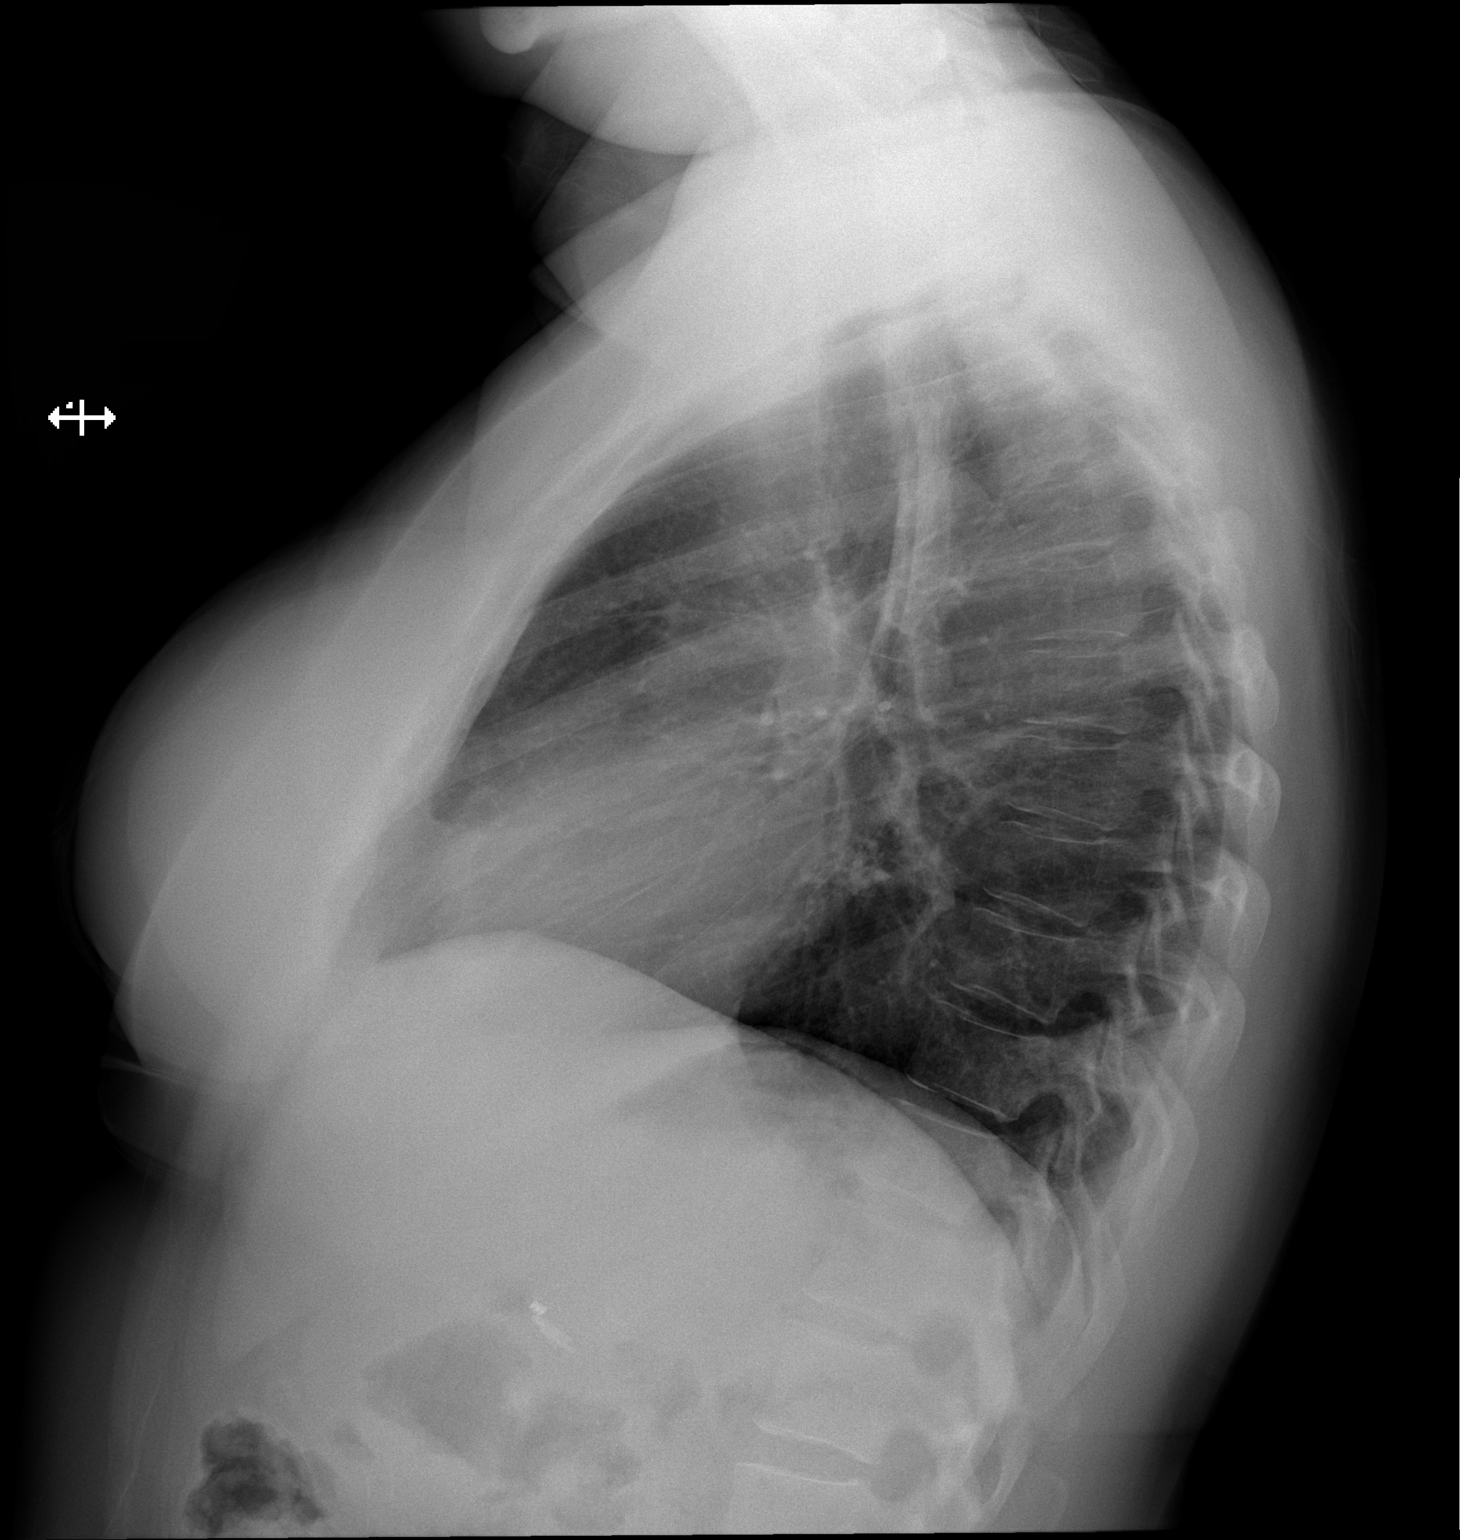

[2 of 2 positions shown; findings below may reference images not displayed]

FINDINGS: The heart size and mediastinal contours are within normal limits.
Both lungs are clear. Mild scoliosis of the spine.
IMPRESSION: No active cardiopulmonary disease.

## 2021-04-24 ENCOUNTER — Ambulatory Visit (INDEPENDENT_AMBULATORY_CARE_PROVIDER_SITE_OTHER): Payer: Medicaid Other | Admitting: Obstetrics and Gynecology

## 2021-04-24 DIAGNOSIS — Z3042 Encounter for surveillance of injectable contraceptive: Secondary | ICD-10-CM

## 2021-04-24 MED ORDER — MEDROXYPROGESTERONE ACETATE 150 MG/ML IM SUSP
150.0000 mg | Freq: Once | INTRAMUSCULAR | Status: AC
  Administered 2021-04-24: 150 mg via INTRAMUSCULAR

## 2021-04-24 NOTE — Progress Notes (Signed)
Date last pap: 08/10/20. ?Last Depo-Provera: 02/06/21. ?Side Effects if any: n/a. ?Serum HCG indicated? N/a. ?Depo-Provera 150 mg IM given by: Douglass Rivers, CMA. ?Next appointment due 07/10/21-07/24/21. ? ?

## 2021-05-01 ENCOUNTER — Ambulatory Visit: Payer: Medicaid Other | Admitting: Dermatology

## 2021-05-01 DIAGNOSIS — L590 Erythema ab igne [dermatitis ab igne]: Secondary | ICD-10-CM

## 2021-05-01 MED ORDER — MOMETASONE FUROATE 0.1 % EX CREA: 1.0000 "application " | TOPICAL_CREAM | CUTANEOUS | 1 refills | Status: AC

## 2021-05-01 NOTE — Patient Instructions (Addendum)
Erythema Ab Igne discoloration at lower legs and feet  ? ?Discontinue using heater  ? ?Start  mometasone cream apply topically to affected areas of feet and legs use 5 days weekly. Can use for a couple of months to affected areas ? ?Topical steroids (such as triamcinolone, fluocinolone, fluocinonide, mometasone, clobetasol, halobetasol, betamethasone, hydrocortisone) can cause thinning and lightening of the skin if they are used for too long in the same area. Your physician has selected the right strength medicine for your problem and area affected on the body. Please use your medication only as directed by your physician to prevent side effects.  ? ? ?If You Need Anything After Your Visit ? ?If you have any questions or concerns for your doctor, please call our main line at [phone] and press option 4 to reach your doctor's medical assistant. If no one answers, please leave a voicemail as directed and we will return your call as soon as possible. Messages left after 4 pm will be answered the following business day.  ? ?You may also send [place] a message via MyChart. We typically respond to MyChart messages within 1-2 business days. ? ?For prescription refills, please ask your pharmacy to contact our office. Our fax number is [phone]. ? ?If you have an urgent issue when the clinic is closed that cannot wait until the next business day, you can page your doctor at the number below.   ? ?Please note that while we do our best to be available for urgent issues outside of office hours, we are not available 24/7.  ? ?If you have an urgent issue and are unable to reach [place], you may choose to seek medical care at your doctor's office, retail clinic, urgent care center, or emergency room. ? ?If you have a medical emergency, please immediately call 911 or go to the emergency department. ? ?Pager Numbers ? ?- Dr. Nehemiah Massed: [phone] ? ?- Dr. Laurence Ferrari: [phone] ? ?- Dr. Nicole Kindred: [phone] ? ?In the event of inclement  weather, please call our main line at [phone] for an update on the status of any delays or closures. ? ?Dermatology Medication Tips: ?Please keep the boxes that topical medications come in in order to help keep track of the instructions about where and how to use these. Pharmacies typically print the medication instructions only on the boxes and not directly on the medication tubes.  ? ?If your medication is too expensive, please contact our office at [phone] option 4 or send [place] a message through [place].  ? ?We are unable to tell what your co-pay for medications will be in advance as this is different depending on your insurance coverage. However, we may be able to find a substitute medication at lower cost or fill out paperwork to get insurance to cover a needed medication.  ? ?If a prior authorization is required to get your medication covered by your insurance company, please allow [place] 1-2 business days to complete this process. ? ?Drug prices often vary depending on where the prescription is filled and some pharmacies may offer cheaper prices. ? ?The website www.goodrx.com contains coupons for medications through different pharmacies. The prices here do not account for what the cost may be with help from insurance (it may be cheaper with your insurance), but the website can give you the price if you did not use any insurance.  ?- You can print the associated coupon and take it with your prescription to the pharmacy.  ?- You may also stop  by our office during regular business hours and pick up a GoodRx coupon card.  ?- If you need your prescription sent electronically to a different pharmacy, notify our office through [facility] or by phone at [phone] option 4. ? ? ? ? ?Si Usted Necesita Algo Despu?s de Su Visita ? ?Tambi?n puede enviarnos un mensaje a trav?s de MyChart. Por lo general respondemos a los mensajes de MyChart en el transcurso de 1 a 2 d?as h?biles. ? ?Para renovar recetas,  por favor pida a su farmacia que se ponga en contacto con nuestra oficina. Nuestro n?mero de fax es el [phone]. ? ?Si tiene un asunto urgente cuando la cl?nica est? cerrada y que no puede esperar hasta el siguiente d?a h?bil, puede llamar/localizar a su doctor(a) al n?mero que aparece a continuaci?n.  ? ?Por favor, tenga en cuenta que aunque hacemos todo lo posible para estar disponibles para asuntos urgentes fuera del horario de oficina, no estamos disponibles las 24 horas del d?a, los 7 d?as de la semana.  ? ?Si tiene un problema urgente y no puede comunicarse con nosotros, puede optar por buscar atenci?n m?dica  en el consultorio de su doctor(a), en una cl?nica privada, en un centro de atenci?n urgente o en una sala de emergencias. ? ?Si tiene Engineer, maintenance (IT) m?dica, por favor llame inmediatamente al 911 o vaya a la sala de emergencias. ? ?N?meros de b?per ? ?- Dr. Nehemiah Massed: [phone] ? ?- Dra. Moye: [phone] ? ?- Dra. Nicole Kindred: [phone] ? ?En caso de inclemencias del tiempo, por favor llame a nuestra l?nea principal al [phone] para una actualizaci?n sobre el estado de cualquier retraso o cierre. ? ?Consejos para la medicaci?n en dermatolog?a: ?Por favor, guarde las cajas en las que vienen los medicamentos de uso t?pico para ayudarle a seguir las instrucciones sobre d?nde y c?mo usarlos. Las farmacias generalmente imprimen las instrucciones del medicamento s?lo en las cajas y no directamente en los tubos del [place].  ? ?Si su medicamento es muy caro, por favor, p?ngase en contacto con Zigmund Daniel llamando al [phone] y presione la opci?n 4 o env?enos un mensaje a trav?s de MyChart.  ? ?No podemos decirle cu?l ser? su copago por los medicamentos por adelantado ya que esto es diferente dependiendo de la cobertura de su seguro. Sin embargo, es posible que podamos encontrar un medicamento sustituto a Electrical engineer un formulario para que el seguro cubra el medicamento que se  considera necesario.  ? ?Si se requiere Ardelia Mems autorizaci?n previa para que su compa??a de seguros [place] su medicamento, por favor perm?tanos de 1 a 2 d?as h?biles para completar este proceso. ? ?Los precios de los medicamentos var?an con frecuencia dependiendo del Environmental consultant de d?nde se surte la receta y alguna farmacias pueden ofrecer precios m?s baratos. ? ?El sitio web www.goodrx.com tiene cupones para medicamentos de Airline pilot. Los precios aqu? no tienen en cuenta lo que podr?a costar con la ayuda del seguro (puede ser m?s barato con su seguro), pero el sitio web puede darle el precio si no utiliz? ning?n seguro.  ?- Puede imprimir el cup?n correspondiente y llevarlo con su receta a la farmacia.  ?- Tambi?n puede pasar por nuestra oficina durante el horario de atenci?n regular y recoger una tarjeta de cupones de GoodRx.  ?- Si necesita que su receta se env?e electr?nicamente a Chiropodist, informe a nuestra oficina a trav?s de MyChart de [place] o por tel?fono llamando al [phone] y presione la opci?n 4. ? ?

## 2021-05-01 NOTE — Progress Notes (Signed)
? ?  New Patient Visit ? ?Subjective  ?Audrey Rowe is a 46 y.o. 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 Spot at lower legs and feet that she developed 4 - 5 months ago. Patient reports swelling in area and areas are painful. ). ? ?Patient has been seen by rheumatology and podiatry.  ? ?The patient was asked and confirmed today that she does use a space heater under her desk in front of her legs and has done so for months to years. ? ?The following portions of the chart were reviewed this encounter and updated as appropriate:  ? Tobacco  Allergies  Meds  Problems  Med Hx  Surg Hx  Fam Hx   ?  ?Review of Systems:  No other skin or systemic complaints except as noted in HPI or Assessment and Plan. ? ?Objective  ?Well appearing patient in no apparent distress; mood and affect are within normal limits. ? ?A focused examination was performed including lower extremities, including the legs, feet, toes, and toenails. Relevant physical exam findings are noted in the Assessment and Plan. ? ?b/l feet and lower legs ? ? ? ? ? ? ? ? ? ? ? ? ? ?Assessment & Plan  ?Erythema ab igne ?b/l feet and lower legs ?Due to use of chronic space heater under her desk for work. ?Advised condition is benign but in very unusual cases you could develop cancer called squamous cell carcinoma of the skin within these areas of the legs. ?Advised it is unlikely that the discoloration will fade quickly.  It may fade gradually but may not really resolved. ?For inflamed areas we will start mometasone cream. ? ?She is advised she must discontinue use of space heaters or any other kind of heating element close to the skin.  She says her daughter may have this to for the same reason.  Advised to inform her about that and stop using space heater near the skin. ? ?Start mometason cream apply topically to affected areas of feet and legs use 5 days weekly can use for several  months ? ?Chronic and persistent condition with duration or expected duration over one year. Condition is symptomatic / bothersome to patient. Not to goal. ? ?mometasone (ELOCON) 0.1 % cream - b/l feet and lower legs ?Apply 1 application. topically See admin instructions. Apply topically to aas of lower legs and feet use 5 days weekly. ? ?Return if symptoms worsen or fail to improve. ? ?I, Ruthell Rummage, CMA, am acting as scribe for Sarina Ser, MD. ?Documentation: I have reviewed the above documentation for accuracy and completeness, and I agree with the above. ? ?Sarina Ser, MD ? ? ?

## 2021-05-02 ENCOUNTER — Encounter: Payer: Medicaid Other | Admitting: Obstetrics and Gynecology

## 2021-05-02 DIAGNOSIS — Z3009 Encounter for other general counseling and advice on contraception: Secondary | ICD-10-CM

## 2021-05-09 ENCOUNTER — Encounter: Payer: Self-pay | Admitting: Dermatology

## 2021-07-05 ENCOUNTER — Telehealth: Payer: Self-pay

## 2021-07-05 NOTE — Telephone Encounter (Signed)
Spoke with patient, advised we will now be using in office depo stock. Patient states understanding and will be here on 07/14/21.

## 2021-07-05 NOTE — Telephone Encounter (Signed)
-----   Message from Delrae Sawyers, [place] sent at 07/05/2021 11:58 AM EDT ----- Marykay Lex called this patient to reschedule her depo for July 3rd. Appointment is now July 7th. Patient is requesting RX be sent. Out of refills.

## 2021-07-10 ENCOUNTER — Ambulatory Visit: Payer: Medicaid Other

## 2021-07-13 NOTE — Progress Notes (Deleted)
Date last pap: 08/10/20. Last Depo-Provera: 04/24/2021 Side Effects if any: n/a. Serum HCG indicated? N/a. Depo-Provera 150 mg IM given by: Cristy Folks, CMA Next appointment due: August 23 - September 6

## 2021-07-14 ENCOUNTER — Ambulatory Visit: Payer: Medicaid Other

## 2021-09-22 ENCOUNTER — Other Ambulatory Visit (HOSPITAL_BASED_OUTPATIENT_CLINIC_OR_DEPARTMENT_OTHER): Payer: Self-pay

## 2021-09-22 ENCOUNTER — Encounter: Payer: Self-pay | Admitting: Oncology

## 2021-09-22 ENCOUNTER — Encounter (HOSPITAL_BASED_OUTPATIENT_CLINIC_OR_DEPARTMENT_OTHER): Payer: Self-pay | Admitting: *Deleted

## 2021-09-22 ENCOUNTER — Emergency Department (HOSPITAL_BASED_OUTPATIENT_CLINIC_OR_DEPARTMENT_OTHER)
Admission: EM | Admit: 2021-09-22 | Discharge: 2021-09-22 | Disposition: A | Discharge status: Home / Self Care | Payer: 59 | Attending: Emergency Medicine | Admitting: Emergency Medicine

## 2021-09-22 ENCOUNTER — Emergency Department (HOSPITAL_BASED_OUTPATIENT_CLINIC_OR_DEPARTMENT_OTHER): Payer: 59

## 2021-09-22 DIAGNOSIS — K219 Gastro-esophageal reflux disease without esophagitis: Secondary | ICD-10-CM | POA: Insufficient documentation

## 2021-09-22 DIAGNOSIS — R197 Diarrhea, unspecified: Secondary | ICD-10-CM

## 2021-09-22 DIAGNOSIS — R109 Unspecified abdominal pain: Secondary | ICD-10-CM | POA: Diagnosis present

## 2021-09-22 DIAGNOSIS — R112 Nausea with vomiting, unspecified: Secondary | ICD-10-CM

## 2021-09-22 DIAGNOSIS — R1084 Generalized abdominal pain: Secondary | ICD-10-CM

## 2021-09-22 LAB — CBC WITH DIFFERENTIAL/PLATELET
Abs Immature Granulocytes: 0.03 10*3/uL (ref 0.00–0.07)
Basophils Absolute: 0.1 10*3/uL (ref 0.0–0.1)
Basophils Relative: 1 %
Eosinophils Absolute: 0.2 10*3/uL (ref 0.0–0.5)
Eosinophils Relative: 2 %
HCT: 40.2 % (ref 36.0–46.0)
Hemoglobin: 14.2 g/dL (ref 12.0–15.0)
Immature Granulocytes: 0 %
Lymphocytes Relative: 23 %
Lymphs Abs: 1.9 10*3/uL (ref 0.7–4.0)
MCH: 34.6 pg — ABNORMAL HIGH (ref 26.0–34.0)
MCHC: 35.3 g/dL (ref 30.0–36.0)
MCV: 98 fL (ref 80.0–100.0)
Monocytes Absolute: 0.8 10*3/uL (ref 0.1–1.0)
Monocytes Relative: 10 %
Neutro Abs: 5.1 10*3/uL (ref 1.7–7.7)
Neutrophils Relative %: 64 %
Platelets: 195 10*3/uL (ref 150–400)
RBC: 4.1 MIL/uL (ref 3.87–5.11)
RDW: 13.3 % (ref 11.5–15.5)
Smear Review: NORMAL
WBC: 8.1 10*3/uL (ref 4.0–10.5)
nRBC: 0 % (ref 0.0–0.2)

## 2021-09-22 LAB — COMPREHENSIVE METABOLIC PANEL
ALT: 13 U/L (ref 0–44)
AST: 18 U/L (ref 15–41)
Albumin: 4.3 g/dL (ref 3.5–5.0)
Alkaline Phosphatase: 59 U/L (ref 38–126)
Anion gap: 9 (ref 5–15)
BUN: 7 mg/dL (ref 6–20)
CO2: 24 mmol/L (ref 22–32)
Calcium: 9.1 mg/dL (ref 8.9–10.3)
Chloride: 105 mmol/L (ref 98–111)
Creatinine, Ser: 0.77 mg/dL (ref 0.44–1.00)
GFR, Estimated: >60 mL/min (ref >60)
Glucose, Bld: 88 mg/dL (ref 70–99)
Potassium: 2.9 mmol/L — ABNORMAL LOW (ref 3.5–5.1)
Sodium: 138 mmol/L (ref 135–145)
Total Bilirubin: 0.8 mg/dL (ref 0.3–1.2)
Total Protein: 7.7 g/dL (ref 6.5–8.1)

## 2021-09-22 LAB — URINALYSIS, ROUTINE W REFLEX MICROSCOPIC
Glucose, UA: NEGATIVE mg/dL
Hgb urine dipstick: NEGATIVE
Ketones, ur: 40 mg/dL — AB
Leukocytes,Ua: NEGATIVE
Nitrite: NEGATIVE
Protein, ur: 30 mg/dL — AB
Specific Gravity, Urine: >=1.03 (ref 1.005–1.030)
pH: 6 (ref 5.0–8.0)

## 2021-09-22 LAB — URINALYSIS, MICROSCOPIC (REFLEX)

## 2021-09-22 LAB — LACTIC ACID, PLASMA: Lactic Acid, Venous: 0.8 mmol/L (ref 0.5–1.9)

## 2021-09-22 LAB — LIPASE, BLOOD: Lipase: 40 U/L (ref 11–51)

## 2021-09-22 LAB — PREGNANCY, URINE: Preg Test, Ur: NEGATIVE

## 2021-09-22 MED ORDER — FENTANYL CITRATE PF 50 MCG/ML IJ SOSY: 50.0000 ug | PREFILLED_SYRINGE | Freq: Once | INTRAMUSCULAR | Status: DC

## 2021-09-22 MED ORDER — SODIUM CHLORIDE 0.9 % IV SOLN
Freq: Once | INTRAVENOUS | Status: AC
Start: 2021-09-22 — End: 2021-09-22

## 2021-09-22 MED ORDER — DICYCLOMINE HCL 10 MG PO CAPS: 10.0000 mg | ORAL_CAPSULE | Freq: Three times a day (TID) | ORAL | 0 refills | Status: DC

## 2021-09-22 MED ORDER — POTASSIUM CHLORIDE CRYS ER 20 MEQ PO TBCR
40.0000 meq | EXTENDED_RELEASE_TABLET | Freq: Once | ORAL | Status: AC
  Administered 2021-09-22: 40 meq via ORAL
  Filled 2021-09-22: qty 2

## 2021-09-22 MED ORDER — POTASSIUM CHLORIDE CRYS ER 10 MEQ PO TBCR
40.0000 meq | EXTENDED_RELEASE_TABLET | Freq: Every day | ORAL | 0 refills | Status: AC
  Filled 2021-09-22: qty 12, 3d supply, fill #0

## 2021-09-22 MED ORDER — FENTANYL CITRATE PF 50 MCG/ML IJ SOSY
25.0000 ug | PREFILLED_SYRINGE | Freq: Once | INTRAMUSCULAR | Status: DC
  Filled 2021-09-22: qty 1

## 2021-09-22 MED ORDER — ONDANSETRON HCL 4 MG/2ML IJ SOLN
4.0000 mg | Freq: Once | INTRAMUSCULAR | Status: AC
  Administered 2021-09-22: 4 mg via INTRAVENOUS
  Filled 2021-09-22: qty 2

## 2021-09-22 MED ORDER — DICYCLOMINE HCL 10 MG PO CAPS
10.0000 mg | ORAL_CAPSULE | Freq: Three times a day (TID) | ORAL | 0 refills | Status: AC
  Filled 2021-09-22: qty 15, 5d supply, fill #0

## 2021-09-22 MED ORDER — IOHEXOL 300 MG/ML  SOLN
100.0000 mL | Freq: Once | INTRAMUSCULAR | Status: AC | PRN
  Administered 2021-09-22: 100 mL via INTRAVENOUS

## 2021-09-22 MED ORDER — ONDANSETRON HCL 4 MG PO TABS: 4.0000 mg | ORAL_TABLET | Freq: Three times a day (TID) | ORAL | 0 refills | Status: DC | PRN

## 2021-09-22 MED ORDER — ONDANSETRON HCL 4 MG PO TABS
4.0000 mg | ORAL_TABLET | Freq: Three times a day (TID) | ORAL | 0 refills | Status: AC | PRN
  Filled 2021-09-22: qty 15, 5d supply, fill #0

## 2021-09-22 MED ORDER — FENTANYL CITRATE PF 50 MCG/ML IJ SOSY
25.0000 ug | PREFILLED_SYRINGE | Freq: Once | INTRAMUSCULAR | Status: AC
  Administered 2021-09-22: 25 ug via INTRAVENOUS

## 2021-09-22 MED ORDER — POTASSIUM CHLORIDE CRYS ER 10 MEQ PO TBCR: 40.0000 meq | EXTENDED_RELEASE_TABLET | Freq: Every day | ORAL | 0 refills | Status: DC

## 2021-09-22 NOTE — ED Notes (Signed)
Patient transported to CT 

## 2021-09-22 NOTE — ED Provider Notes (Signed)
[facility] EMERGENCY DEPARTMENT Provider Note   CSN: [id number] Arrival date & time: 09/22/21  0715     History  Chief Complaint  Patient presents with   Abdominal Pain    Audrey Rowe is a 46 y.o. female.  46 year old female with history of anxiety, depression, GERD, chronic back pain presented to emergency department with complaints of abdominal pain. Patient complaining of diffuse upper abdominal pain associate with nausea, vomiting, increased belching and diarrhea for 3 weeks.  Pain is nonradiating, on and off throughout the day, worse after eating and immediate vomiting after eating.  2-3 loose stools per day. Denies blood in the stool or vomitus. Pain denies chest pain, shortness of breath, palpitations, dizziness fever or chills.  Denies urinary symptoms.  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 Her vital signs are stable.  No acute visible distress.   The history is provided by the patient. No language interpreter was used.  Abdominal Pain Pain location:  Generalized Pain quality: bloating, dull and heavy   Pain quality: not cramping   Pain radiates to:  Does not radiate Pain severity:  Moderate Duration:  3 weeks Progression:  Waxing and waning Relieved by:  Nothing      Home Medications Prior to Admission medications   Medication Sig Start Date End Date Taking? Authorizing Provider  baclofen (LIORESAL) 10 MG tablet Take 10 mg by mouth 2 (two) times daily. 08/01/20   [provider]  busPIRone (BUSPAR) 15 MG tablet buspirone 15 mg tablet 04/16/19   [provider]  celecoxib (CELEBREX) 100 MG capsule Take 1 capsule (100 mg total) by mouth 2 (two) times daily. 09/09/19   Hyatt, Max T, DPM  chlorthalidone (HYGROTON) 25 MG tablet TAKE 1 TABLET BY MOUTH DAILY IN THE MORNING 09/15/19   [provider]  cyclobenzaprine (FLEXERIL) 10 MG tablet      [provider]  diazepam (VALIUM) 2 MG tablet Take 1 tablet (2 mg total) by mouth every 8 (eight) hours as needed for muscle spasms. 06/29/20   Paulette Blanch, MD  dicyclomine (BENTYL) 10 MG capsule Take 1 capsule (10 mg total) by mouth 3 (three) times daily for 5 days. 09/22/21 09/27/21  Sherrill Raring, PA-C  famotidine (PEPCID) 20 MG tablet Take 1 tablet (20 mg total) by mouth daily. 05/23/18 08/10/20  Nance Pear, MD  ferrous sulfate 325 (65 FE) MG tablet Take by mouth.    [provider]  FLUoxetine (PROZAC) 20 MG tablet Take 20 mg by mouth daily.    [provider]  hydrochlorothiazide (HYDRODIURIL) 25 MG tablet Take by mouth.    [provider]  hydrOXYzine (ATARAX/VISTARIL) 25 MG tablet hydroxyzine HCl 25 mg tablet    [provider]  medroxyPROGESTERone (DEPO-PROVERA) 150 MG/ML injection Inject 1 mL (150 mg total) into the muscle every 3 (three) months. 02/01/21   Harlin Heys, MD  meloxicam (MOBIC) 15 MG tablet meloxicam 15 mg tablet  Take 1 tablet every day by oral route.    [provider]  mometasone (ELOCON) 0.1 % cream Apply 1 application. topically See admin instructions. Apply topically to aas of lower legs and feet use 5 days weekly. 05/01/21   Ralene Bathe, MD  naproxen (NAPROSYN) 500 MG tablet Take 1 tablet (500 mg total) by mouth 2 (two) times daily with a meal. 06/29/20   Paulette Blanch, MD  ondansetron (  ZOFRAN) 4 MG tablet Take 1 tablet (4 mg total) by mouth every 8 (eight) hours as needed for up to 5 days for nausea or vomiting. 09/22/21 09/27/21  Sherrill Raring, PA-C  oxyCODONE-acetaminophen (PERCOCET/ROXICET) 5-325 MG tablet Take 1 tablet by mouth every 4 (four) hours as needed for severe pain. 06/29/20   Paulette Blanch, MD  potassium chloride (KLOR-CON M) 10 MEQ tablet Take 4 tablets (40 mEq total) by mouth daily. 09/22/21   Sherrill Raring, PA-C  prazosin (MINIPRESS) 1 MG capsule prazosin 1 mg capsule 04/16/19   [provider]  traZODone (DESYREL) 50 MG tablet Take 50 mg by mouth daily. 09/17/18   [provider]  valACYclovir (VALTREX) 500 MG tablet Take 1 tablet (500 mg total) by mouth daily. Can increase to twice a day for 5 days in the event of a recurrence 08/10/20   Harlin Heys, MD  VIORELE 0.15-0.02/0.01 MG (21/5) tablet TAKE 1 TABLET BY MOUTH AT BEDTIME 03/24/19   Harlin Heys, MD      Allergies    Tramadol    Review of Systems   Review of Systems  Gastrointestinal:  Positive for abdominal pain.    Physical Exam Updated Vital Signs BP (!) 168/109   Pulse 88   Temp 98.5 F (36.9 C) (Oral)   Resp 16   Ht 5' (1.524 m)   Wt 72.6 kg   SpO2 100%   BMI 31.25 kg/m  Physical Exam Vitals and nursing note reviewed.  Constitutional:      Appearance: She is well-developed.  HENT:     Head: Normocephalic.  Cardiovascular:     Rate and Rhythm: Normal rate and regular rhythm.  Pulmonary:     Effort: Pulmonary effort is normal.     Breath sounds: Normal breath sounds.  Abdominal:     General: Bowel sounds are normal. There is no distension or abdominal bruit. There are no signs of injury.     Palpations: Abdomen is soft.     Tenderness: There is generalized abdominal tenderness. There is guarding. There is no right CVA tenderness or left CVA tenderness. Negative signs include psoas sign.     Hernia: No hernia is present.       Comments: Diffuse upper abdominal pain. Mild to LLQ   Neurological:     Mental Status: She is alert.     ED Results / Procedures / Treatments   Labs (all labs ordered are listed, but only abnormal results are displayed) Labs Reviewed  URINALYSIS, ROUTINE W REFLEX MICROSCOPIC - Abnormal; Notable for the following components:      Result Value   APPearance CLOUDY (*)    Bilirubin Urine SMALL (*)    Ketones, ur 40 (*)    Protein, ur 30 (*)    All other components within normal limits  CBC WITH DIFFERENTIAL/PLATELET - Abnormal; Notable for the  following components:   MCH 34.6 (*)    All other components within normal limits  COMPREHENSIVE METABOLIC PANEL - Abnormal; Notable for the following components:   Potassium 2.9 (*)    All other components within normal limits  URINALYSIS, MICROSCOPIC (REFLEX) - Abnormal; Notable for the following components:   Bacteria, UA MANY (*)    All other components within normal limits  LIPASE, BLOOD  LACTIC ACID, PLASMA  PREGNANCY, URINE    EKG None  Radiology CT ABDOMEN PELVIS W CONTRAST  Result Date: 09/22/2021 CLINICAL DATA:  Acute generalized abdominal pain. EXAM: CT ABDOMEN  AND PELVIS WITH CONTRAST TECHNIQUE: Multidetector CT imaging of the abdomen and pelvis was performed using the standard protocol following bolus administration of intravenous contrast. RADIATION DOSE REDUCTION: This exam was performed according to the departmental dose-optimization program which includes automated exposure control, adjustment of the mA and/or kV according to patient size and/or use of iterative reconstruction technique. CONTRAST:  155m OMNIPAQUE IOHEXOL 300 MG/ML  SOLN COMPARISON:  September 16, 2017. FINDINGS: Lower chest: No acute abnormality. Hepatobiliary: No focal liver abnormality is seen. Status post cholecystectomy. No biliary dilatation. Pancreas: Unremarkable. No pancreatic ductal dilatation or surrounding inflammatory changes. Spleen: Normal in size without focal abnormality. Adrenals/Urinary Tract: Adrenal glands are unremarkable. Small nonobstructive left renal calculus is noted. No hydronephrosis or renal obstruction is noted. Urinary bladder is unremarkable. Stomach/Bowel: Stomach is within normal limits. Appendix appears normal. No evidence of bowel wall thickening, distention, or inflammatory changes. Vascular/Lymphatic: No significant vascular findings are present. No enlarged abdominal or pelvic lymph nodes. Reproductive: At least 2 uterine fibroids are noted, the largest measuring  approximately 3 mm. Ligation clips are seen in the expected locations of bilateral fallopian tubes. No other definite adnexal abnormalities noted. Other: No abdominal wall hernia or abnormality. No abdominopelvic ascites. Musculoskeletal: No acute or significant osseous findings. IMPRESSION: Small nonobstructive left renal calculus. No hydronephrosis or renal obstruction is noted. Uterine fibroids. Electronically Signed   By: JMarijo ConceptionM.D.   On: 09/22/2021 09:55    Procedures Procedures    Medications Ordered in ED Medications  0.9 %  sodium chloride infusion (0 mLs Intravenous Stopped 09/22/21 1015)  ondansetron (ZOFRAN) injection 4 mg (4 mg Intravenous Given 09/22/21 0853)  fentaNYL (SUBLIMAZE) injection 25 mcg (25 mcg Intravenous Given 09/22/21 0854)  iohexol (OMNIPAQUE) 300 MG/ML solution 100 mL (100 mLs Intravenous Contrast Given 09/22/21 0929)  potassium chloride SA (KLOR-CON M) CR tablet 40 mEq (40 mEq Oral Given 09/22/21 1034)    ED Course/ Medical Decision Making/ A&P                           Medical Decision Making 45year old female with history of anxiety, depression, GERD, chronic lower back pain presented to emergency department for abdominal pain. Patient reports diffuse upper abdominal pain associate with nausea, vomiting, diarrhea for 3 weeks.  2 to 3-3 loose stools per day.  Patient only vomits after she eats.  Pain is nonradiating.  She also reports increase dyspepsia.  She denies fever, chills.  She denies urinary symptoms. Denies chest pain, shortness of breath, palpitations or dizziness.  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  Patient is unsure if she got something at work. Patient alert and oriented x3.  Mild distress secondary to upper abdominal pain.  Skin nonicterus.  S1-S2 normal without murmurs or gallops.  Lungs clear to auscultation.  Abdomen soft, diffusely tender to light and deep palpation.  Worse at  left upper and left lower quadrant.  No CVA tenderness.  No pulsation or masses appreciated.  No skin rash. Vital signs stable.  Patient is afebrile. Differential diagnoses include acute viral gastroenteritis, gastritis, colitis, pancreatitis.  Lab will include CBC, CMP, lipase, uric acid, UA, CT abdomen with contrast.  She will also get IV fluids, medication for pain and nausea. Pt was offered COVID test, she declined. I will continue to monitor the patient.  Patient given bolus of normal saline which she tolerated well.  Normal lactic acid.  Normal lipase.  Urine shows many bacteria, negative for white blood cells, leukocyte esterase and nitrites.  Ketonuria is likely from dehydration.  Potassium is noted to be 2.9.  I will give her 40 of oral potassium and send her home with 40 mEq once a day for 3 days.  CT abdomen: Negative for acute process.  Nonobstructing left ureteral calculi without hydronephrosis and uterine fibroids were noted.  I have discussed the CT abdomen findings with the patient.  Highly unlikely patient's symptoms related to small ureteral calculi which should spontaneously pass however patient was informed if the pain in the flank or blood in the urine noted she will return to emergency department for follow-up with a primary care.  Patient follow-up with OB/GYN in regards to uterine fibroids.  Patient's presentation is highly likely from viral gastroenteritis/gastritis.  Patient was encouraged to increase Pepcid to twice daily.  Patient was encouraged to follow-up with the primary care physician for further recommendation is for epigastric/abdominal pain.  Patient will be discharged home with a prescription of oral potassium, dicyclomine and Zofran.  Patient in no distress and overall condition improved here in the ED. Detailed discussions were had with the patient regarding current findings, and need for close f/u with PCP or on call doctor. The patient has been instructed to  return immediately if the symptoms worsen in any way for re-evaluation. Patient verbalized understanding and is in agreement with current care plan. All questions answered prior to discharge.     Amount and/or Complexity of Data Reviewed Labs: ordered.          Final Clinical Impression(s) / ED Diagnoses Final diagnoses:  Generalized abdominal pain  Nausea and vomiting, unspecified vomiting type  Diarrhea, unspecified type    Rx / DC Orders ED Discharge Orders          Ordered    potassium chloride (KLOR-CON M) 10 MEQ tablet  Daily,   Status:  Discontinued        09/22/21 1032    dicyclomine (BENTYL) 10 MG capsule  3 times daily,   Status:  Discontinued        09/22/21 1032    ondansetron (ZOFRAN) 4 MG tablet  Every 8 hours PRN,   Status:  Discontinued        09/22/21 1032    dicyclomine (BENTYL) 10 MG capsule  3 times daily        09/22/21 1050    ondansetron (ZOFRAN) 4 MG tablet  Every 8 hours PRN        09/22/21 1050    potassium chloride (KLOR-CON M) 10 MEQ tablet  Daily        09/22/21 1050              Emili Mcloughlin, MD 09/22/21 1104    Tegeler, Gwenyth Allegra, MD 09/22/21 1445

## 2021-09-22 NOTE — Discharge Instructions (Addendum)
Take all the prescribed medications.  Increase Pepcid twice daily. Follow-up with the primary care physician for further evaluation of the abdominal pain. Increase hydration and rest. If abdominal pain worsens or you develop fever advised to return to emergency department for reevaluation.

## 2021-09-22 NOTE — ED Notes (Signed)
Baby heart rate 158 .

## 2021-09-22 NOTE — ED Notes (Signed)
To restroom to for urine spec

## 2021-09-22 NOTE — ED Triage Notes (Signed)
Presents with nausea and vomiting accompanied with abd pain. Onset approx 3 weeks ago.

## 2022-01-04 ENCOUNTER — Encounter: Payer: Self-pay | Admitting: Oncology

## 2022-01-09 ENCOUNTER — Other Ambulatory Visit: Payer: Self-pay

## 2022-01-09 ENCOUNTER — Ambulatory Visit: Payer: 59 | Admitting: Podiatry

## 2022-02-21 ENCOUNTER — Ambulatory Visit: Payer: Medicaid Other | Admitting: Podiatry

## 2022-02-27 IMAGING — CR DG CERVICAL SPINE COMPLETE 4+V
6 series · 6 of 6 positions shown · non-contrast
Comparison: Chest x-ray 08/13/2019.

CLINICAL DATA: Pain with radiation into left hip.

EXAM:
CERVICAL SPINE - COMPLETE 4+ VIEW

[c-spine lat]
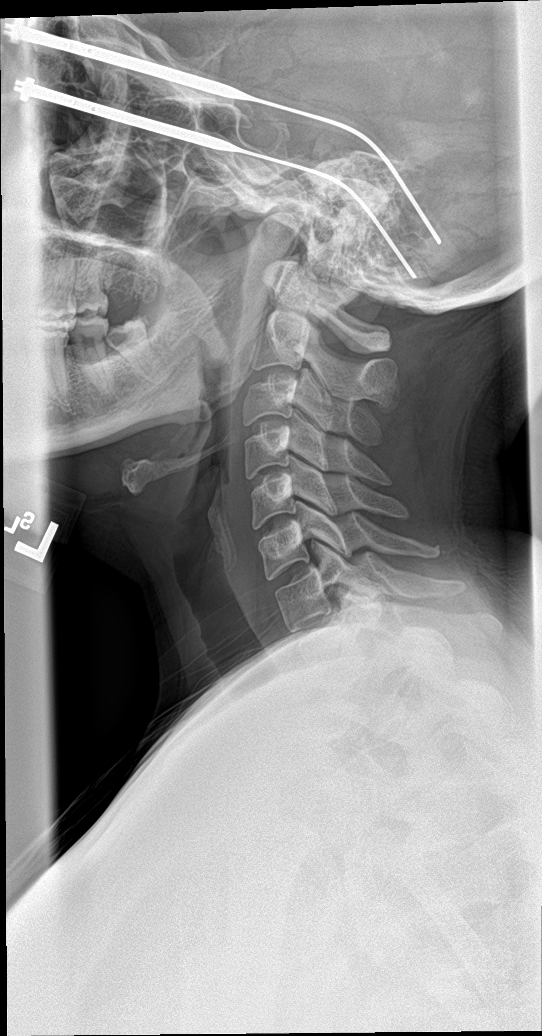

[c-spine obl (1 of 2)]
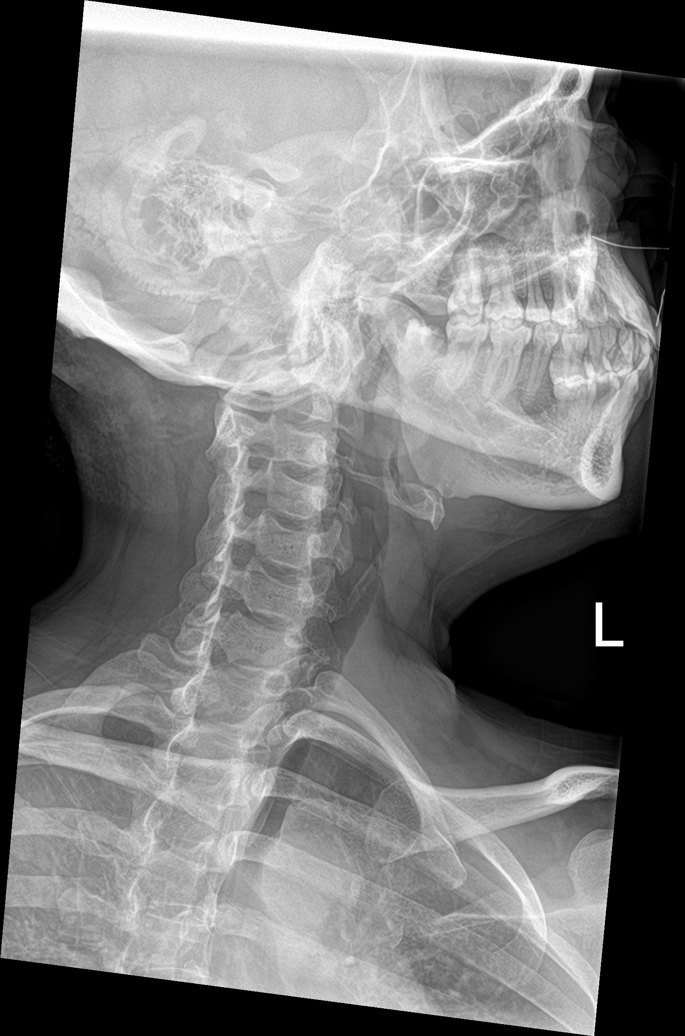

[c-spine obl (2 of 2)]
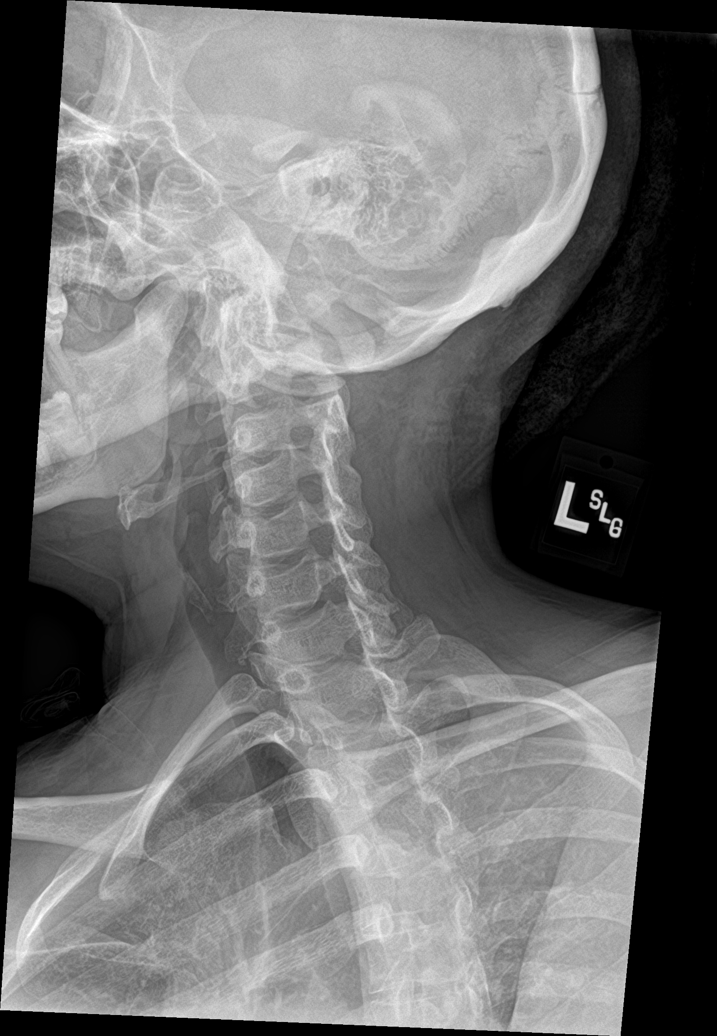

[c-spine ap]
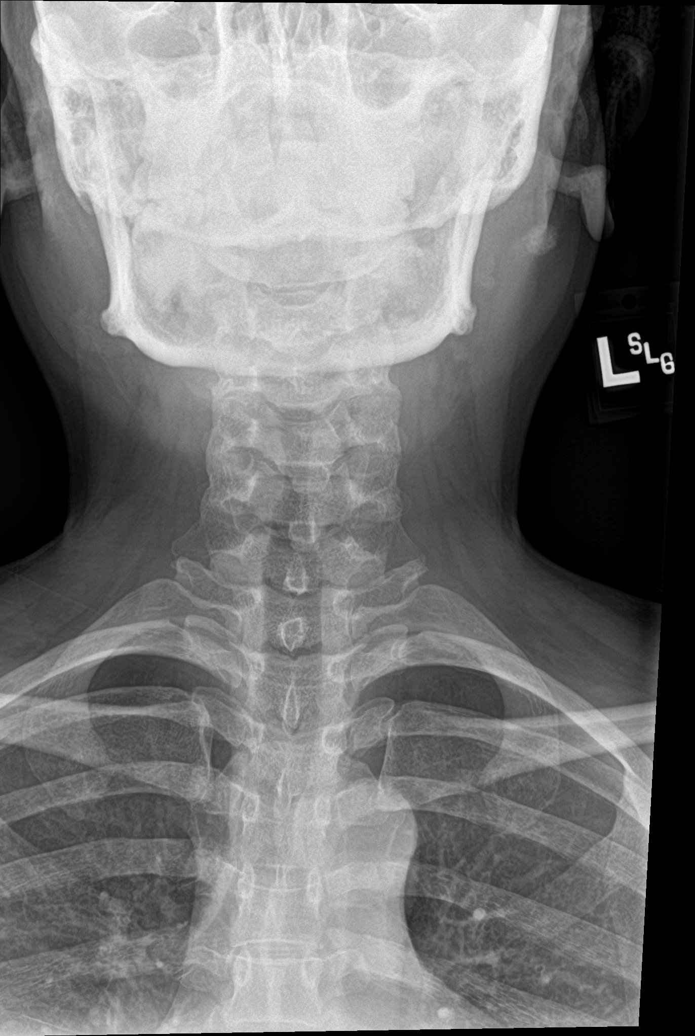

[c-spine open mouth]
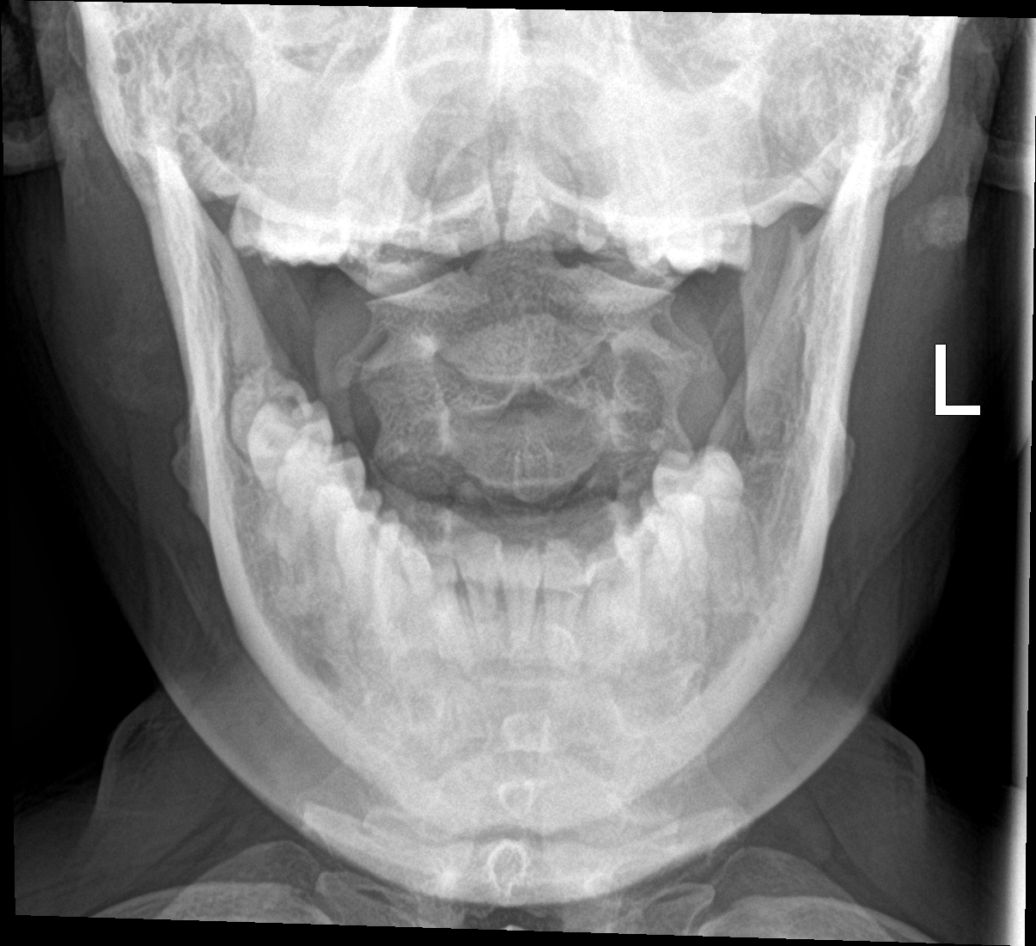

[[person_name]]
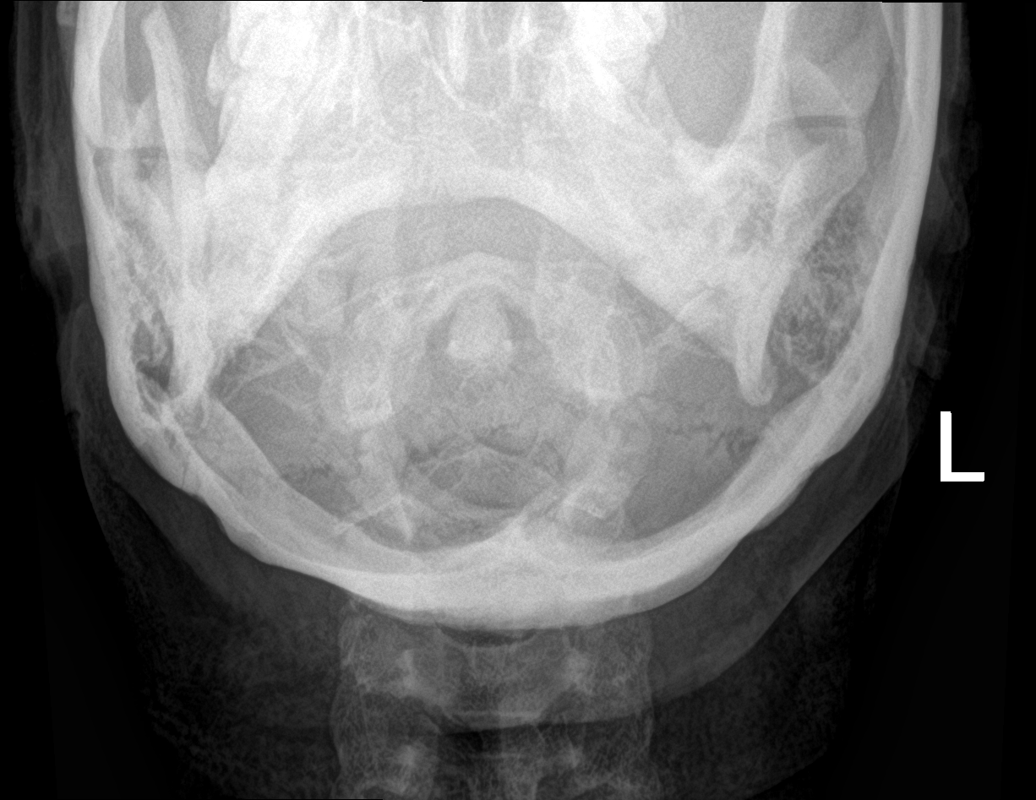

[6 of 6 positions shown; findings below may reference images not displayed]

FINDINGS: No acute soft tissue bony abnormality. No evidence of fracture or
dislocation. Mild C7-T1 facet hypertrophy. Pulmonary apices are
clear.
IMPRESSION: Mild C7-T1 facet hypertrophy.  Exam otherwise unremarkable.

## 2022-03-01 ENCOUNTER — Emergency Department (HOSPITAL_BASED_OUTPATIENT_CLINIC_OR_DEPARTMENT_OTHER)
Admission: EM | Admit: 2022-03-01 | Discharge: 2022-03-01 | Disposition: A | Discharge status: Home / Self Care | Payer: Medicaid Other | Attending: Emergency Medicine | Admitting: Emergency Medicine

## 2022-03-01 ENCOUNTER — Encounter: Payer: Self-pay | Admitting: Oncology

## 2022-03-01 ENCOUNTER — Other Ambulatory Visit: Payer: Self-pay

## 2022-03-01 ENCOUNTER — Encounter (HOSPITAL_BASED_OUTPATIENT_CLINIC_OR_DEPARTMENT_OTHER): Payer: Self-pay | Admitting: Emergency Medicine

## 2022-03-01 DIAGNOSIS — E876 Hypokalemia: Secondary | ICD-10-CM | POA: Insufficient documentation

## 2022-03-01 DIAGNOSIS — R112 Nausea with vomiting, unspecified: Secondary | ICD-10-CM | POA: Diagnosis not present

## 2022-03-01 DIAGNOSIS — R1013 Epigastric pain: Secondary | ICD-10-CM | POA: Diagnosis not present

## 2022-03-01 DIAGNOSIS — Z853 Personal history of malignant neoplasm of breast: Secondary | ICD-10-CM | POA: Insufficient documentation

## 2022-03-01 DIAGNOSIS — R109 Unspecified abdominal pain: Secondary | ICD-10-CM | POA: Diagnosis present

## 2022-03-01 LAB — URINALYSIS, ROUTINE W REFLEX MICROSCOPIC
Bilirubin Urine: NEGATIVE
Glucose, UA: NEGATIVE mg/dL
Hgb urine dipstick: NEGATIVE
Ketones, ur: NEGATIVE mg/dL
Leukocytes,Ua: NEGATIVE
Nitrite: NEGATIVE
Protein, ur: NEGATIVE mg/dL
Specific Gravity, Urine: 1.025 (ref 1.005–1.030)
pH: 7 (ref 5.0–8.0)

## 2022-03-01 LAB — CBC WITH DIFFERENTIAL/PLATELET
Abs Immature Granulocytes: 0.02 10*3/uL (ref 0.00–0.07)
Basophils Absolute: 0.1 10*3/uL (ref 0.0–0.1)
Basophils Relative: 1 %
Eosinophils Absolute: 0.2 10*3/uL (ref 0.0–0.5)
Eosinophils Relative: 2 %
HCT: 32.1 % — ABNORMAL LOW (ref 36.0–46.0)
Hemoglobin: 10.9 g/dL — ABNORMAL LOW (ref 12.0–15.0)
Immature Granulocytes: 0 %
Lymphocytes Relative: 28 %
Lymphs Abs: 2 10*3/uL (ref 0.7–4.0)
MCH: 31.5 pg (ref 26.0–34.0)
MCHC: 34 g/dL (ref 30.0–36.0)
MCV: 92.8 fL (ref 80.0–100.0)
Monocytes Absolute: 0.8 10*3/uL (ref 0.1–1.0)
Monocytes Relative: 11 %
Neutro Abs: 3.9 10*3/uL (ref 1.7–7.7)
Neutrophils Relative %: 58 %
Platelets: 194 10*3/uL (ref 150–400)
RBC: 3.46 MIL/uL — ABNORMAL LOW (ref 3.87–5.11)
RDW: 15.4 % (ref 11.5–15.5)
Smear Review: NORMAL
WBC: 7 10*3/uL (ref 4.0–10.5)
nRBC: 0 % (ref 0.0–0.2)

## 2022-03-01 LAB — COMPREHENSIVE METABOLIC PANEL
ALT: 13 U/L (ref 0–44)
AST: 15 U/L (ref 15–41)
Albumin: 3.8 g/dL (ref 3.5–5.0)
Alkaline Phosphatase: 58 U/L (ref 38–126)
Anion gap: 7 (ref 5–15)
BUN: 9 mg/dL (ref 6–20)
CO2: 24 mmol/L (ref 22–32)
Calcium: 8.7 mg/dL — ABNORMAL LOW (ref 8.9–10.3)
Chloride: 105 mmol/L (ref 98–111)
Creatinine, Ser: 0.69 mg/dL (ref 0.44–1.00)
GFR, Estimated: >60 mL/min (ref >60)
Glucose, Bld: 93 mg/dL (ref 70–99)
Potassium: 3.3 mmol/L — ABNORMAL LOW (ref 3.5–5.1)
Sodium: 136 mmol/L (ref 135–145)
Total Bilirubin: 0.5 mg/dL (ref 0.3–1.2)
Total Protein: 7.1 g/dL (ref 6.5–8.1)

## 2022-03-01 LAB — LIPASE, BLOOD: Lipase: 36 U/L (ref 11–51)

## 2022-03-01 LAB — PREGNANCY, URINE: Preg Test, Ur: NEGATIVE

## 2022-03-01 MED ORDER — ONDANSETRON HCL 4 MG/2ML IJ SOLN
4.0000 mg | Freq: Once | INTRAMUSCULAR | Status: AC
  Administered 2022-03-01: 4 mg via INTRAVENOUS
  Filled 2022-03-01: qty 2

## 2022-03-01 MED ORDER — SODIUM CHLORIDE 0.9 % IV BOLUS
500.0000 mL | Freq: Once | INTRAVENOUS | Status: AC
  Administered 2022-03-01: 500 mL via INTRAVENOUS

## 2022-03-01 MED ORDER — ONDANSETRON 4 MG PO TBDP: 4.0000 mg | ORAL_TABLET | Freq: Three times a day (TID) | ORAL | 0 refills | Status: AC | PRN

## 2022-03-01 MED ORDER — POTASSIUM CHLORIDE CRYS ER 20 MEQ PO TBCR
20.0000 meq | EXTENDED_RELEASE_TABLET | Freq: Once | ORAL | Status: AC
  Administered 2022-03-01: 20 meq via ORAL
  Filled 2022-03-01: qty 1

## 2022-03-01 MED ORDER — FENTANYL CITRATE PF 50 MCG/ML IJ SOSY
50.0000 ug | PREFILLED_SYRINGE | Freq: Once | INTRAMUSCULAR | Status: AC
  Administered 2022-03-01: 50 ug via INTRAVENOUS
  Filled 2022-03-01: qty 1

## 2022-03-01 MED ORDER — PANTOPRAZOLE SODIUM 40 MG IV SOLR
40.0000 mg | Freq: Once | INTRAVENOUS | Status: AC
  Administered 2022-03-01: 40 mg via INTRAVENOUS
  Filled 2022-03-01: qty 10

## 2022-03-01 NOTE — ED Triage Notes (Signed)
Pt is c/o abd pain with nausea and vomiting x 2 - 3 days  Pt is also c/o pain to the top and lower  part of her back that she states is an ongoing issue

## 2022-03-01 NOTE — ED Provider Notes (Signed)
Audrey Rowe   Audrey Rowe Arrival date & time: 03/01/22  0544     History  Chief Complaint  Patient presents with   Abdominal Pain    Audrey Rowe is a 47 y.o. female.  The history is provided by the patient.  Abdominal Pain Audrey Rowe is a 47 y.o. female who presents to the Emergency Department complaining of nausea vomiting abdominal pain.  She presents to the emergency department accompanied by her husband for evaluation of symptoms that started about 5 to 6 days ago.  She reports nausea and vomiting with 2-3 episodes of emesis daily.  This is described as nonbloody.  She also reports upper abdominal pain and discomfort that she relates is secondary to vomiting.  No diarrhea but does have mild constipation.  No dysuria, chest pain, shortness of breath.  She did have a similar episode of nausea and vomiting several weeks ago, which resolved without intervention.  She also reports chronic upper and lower back pain that has been an issue for several years.  She felt like her pain was bothering her more today.  She has a history of breast cancer status postmastectomy greater than 10 years ago and is currently in remission.  She also has anemia and anxiety.  She is status postcholecystectomy as well as C-section x 2.S/p cholecystectomy.   No tobacco.  1-2 bottles of wine a week.  No drugs.   Taking naproxen 2-3 daily for chronic back pain.         Home Medications Prior to Admission medications   Medication Sig Start Date End Date Taking? Authorizing Provider  ondansetron (ZOFRAN-ODT) 4 MG disintegrating tablet Take 1 tablet (4 mg total) by mouth every 8 (eight) hours as needed for nausea or vomiting. 03/01/22  Yes Quintella Reichert, MD  baclofen (LIORESAL) 10 MG tablet Take 10 mg by mouth 2 (two) times daily. 08/01/20   [provider]  busPIRone (BUSPAR) 15 MG tablet buspirone 15 mg tablet 04/16/19    [provider]  celecoxib (CELEBREX) 100 MG capsule Take 1 capsule (100 mg total) by mouth 2 (two) times daily. 09/09/19   Hyatt, Max T, DPM  chlorthalidone (HYGROTON) 25 MG tablet TAKE 1 TABLET BY MOUTH DAILY IN THE MORNING 09/15/19   [provider]  cyclobenzaprine (FLEXERIL) 10 MG tablet     [provider]  diazepam (VALIUM) 2 MG tablet Take 1 tablet (2 mg total) by mouth every 8 (eight) hours as needed for muscle spasms. 06/29/20   Paulette Blanch, MD  dicyclomine (BENTYL) 10 MG capsule Take 1 capsule (10 mg total) by mouth 3 (three) times daily for 5 days. 09/22/21 09/27/21  Sherrill Raring, PA-C  famotidine (PEPCID) 20 MG tablet Take 1 tablet (20 mg total) by mouth daily. 05/23/18 08/10/20  Nance Pear, MD  ferrous sulfate 325 (65 FE) MG tablet Take by mouth.    [provider]  FLUoxetine (PROZAC) 20 MG tablet Take 20 mg by mouth daily.    [provider]  hydrochlorothiazide (HYDRODIURIL) 25 MG tablet Take by mouth.    [provider]  hydrOXYzine (ATARAX/VISTARIL) 25 MG tablet hydroxyzine HCl 25 mg tablet    [provider]  medroxyPROGESTERone (DEPO-PROVERA) 150 MG/ML injection Inject 1 mL (150 mg total) into the muscle every 3 (three) months. 02/01/21   Harlin Heys, MD  meloxicam (MOBIC) 15 MG tablet meloxicam 15 mg tablet  Take 1 tablet  every day by oral route.    [provider]  mometasone (ELOCON) 0.1 % cream Apply 1 application. topically See admin instructions. Apply topically to aas of lower legs and feet use 5 days weekly. 05/01/21   Ralene Bathe, MD  naproxen (NAPROSYN) 500 MG tablet Take 1 tablet (500 mg total) by mouth 2 (two) times daily with a meal. 06/29/20   Paulette Blanch, MD  oxyCODONE-acetaminophen (PERCOCET/ROXICET) 5-325 MG tablet Take 1 tablet by mouth every 4 (four) hours as needed for severe pain. 06/29/20   Paulette Blanch, MD  potassium chloride (KLOR-CON M) 10 MEQ tablet Take 4 tablets (40 mEq  total) by mouth daily. 09/22/21   Sherrill Raring, PA-C  prazosin (MINIPRESS) 1 MG capsule prazosin 1 mg capsule 04/16/19   [provider]  traZODone (DESYREL) 50 MG tablet Take 50 mg by mouth daily. 09/17/18   [provider]  valACYclovir (VALTREX) 500 MG tablet Take 1 tablet (500 mg total) by mouth daily. Can increase to twice a day for 5 days in the event of a recurrence 08/10/20   Harlin Heys, MD  VIORELE 0.15-0.02/0.01 MG (21/5) tablet TAKE 1 TABLET BY MOUTH AT BEDTIME 03/24/19   Harlin Heys, MD      Allergies    Tramadol    Review of Systems   Review of Systems  Gastrointestinal:  Positive for abdominal pain.    Physical Exam Updated Vital Signs BP 127/80   Pulse 67   Temp 98 F (36.7 C)   Resp 17   Ht 5' (1.524 m)   Wt 59 kg   LMP 02/15/2022 (Approximate)   SpO2 100%   BMI 25.39 kg/m  Physical Exam  ED Results / Procedures / Treatments   Labs (all labs ordered are listed, but only abnormal results are displayed) Labs Reviewed  COMPREHENSIVE METABOLIC PANEL - Abnormal; Notable for the following components:      Result Value   Potassium 3.3 (*)    Calcium 8.7 (*)    All other components within normal limits  CBC WITH DIFFERENTIAL/PLATELET - Abnormal; Notable for the following components:   RBC 3.46 (*)    Hemoglobin 10.9 (*)    HCT 32.1 (*)    All other components within normal limits  LIPASE, BLOOD  PREGNANCY, URINE  URINALYSIS, ROUTINE W REFLEX MICROSCOPIC    EKG EKG Interpretation  Date/Time:  Thursday March 01 2022 06:05:56 EST Ventricular Rate:  71 PR Interval:  153 QRS Duration: 85 QT Interval:  386 QTC Calculation: 420 R Axis:   -29 Text Interpretation: Sinus rhythm Inferior infarct, old Anterior infarct, old similar compared to prior Aug 03 2018 Confirmed by Quintella Reichert [phone]) on 03/01/2022 6:31:06 AM  Radiology No results found.  Procedures Procedures    Medications Ordered in ED Medications  pantoprazole  (PROTONIX) injection 40 mg (40 mg Intravenous Given 03/01/22 0633)  fentaNYL (SUBLIMAZE) injection 50 mcg (50 mcg Intravenous Given 03/01/22 0633)  ondansetron (ZOFRAN) injection 4 mg (4 mg Intravenous Given 03/01/22 [id number])  sodium chloride 0.9 % bolus 500 mL (0 mLs Intravenous Stopped 03/01/22 0750)  potassium chloride SA (KLOR-CON M) CR tablet 20 mEq (20 mEq Oral Given 03/01/22 0750)    ED Course/ Medical Decision Making/ A&P                             Medical Decision Making Amount and/or Complexity of Data Reviewed Labs: ordered.  Risk Prescription drug management.   Patient with history of breast cancer in remission here for evaluation of epigastric pain, nausea and vomiting.  She is nontoxic-appearing on evaluation.  She has minimal tenderness without peritoneal findings.  Labs are not consistent with acute biliary obstruction, pancreatitis.  CMP with mild hypokalemia.  This was replaced orally.  Patient treated with medications to the emergency department with improvement in her symptoms.  No recurrent vomiting.  Plan to discharge home with treatment for gastritis.  She does have PPI at home she can use.  Will prescribe ondansetron for as needed nausea or vomiting.  Discussed avoiding NSAIDs as this will exacerbate her pain.  Discussed outpatient follow-up and return precautions.        Final Clinical Impression(s) / ED Diagnoses Final diagnoses:  Nausea and vomiting, unspecified vomiting type  Epigastric pain  Hypokalemia    Rx / DC Orders ED Discharge Orders          Ordered    ondansetron (ZOFRAN-ODT) 4 MG disintegrating tablet  Every 8 hours PRN        03/01/22 0700              Quintella Reichert, MD 03/01/22 [phone]

## 2022-03-09 ENCOUNTER — Encounter: Payer: Self-pay | Admitting: Oncology

## 2022-03-12 ENCOUNTER — Ambulatory Visit: Payer: 59 | Admitting: Nurse Practitioner

## 2022-03-13 ENCOUNTER — Other Ambulatory Visit: Payer: Self-pay

## 2022-03-19 ENCOUNTER — Ambulatory Visit (INDEPENDENT_AMBULATORY_CARE_PROVIDER_SITE_OTHER): Payer: Managed Care, Other (non HMO) | Admitting: Podiatry

## 2022-03-19 ENCOUNTER — Encounter: Payer: Self-pay | Admitting: Oncology

## 2022-03-19 ENCOUNTER — Encounter: Payer: Self-pay | Admitting: Podiatry

## 2022-03-19 VITALS — BP 116/74 | HR 77

## 2022-03-19 DIAGNOSIS — L600 Ingrowing nail: Secondary | ICD-10-CM | POA: Diagnosis not present

## 2022-03-19 DIAGNOSIS — M2011 Hallux valgus (acquired), right foot: Secondary | ICD-10-CM | POA: Diagnosis not present

## 2022-03-19 DIAGNOSIS — M21611 Bunion of right foot: Secondary | ICD-10-CM

## 2022-03-19 MED ORDER — NEOMYCIN-POLYMYXIN-HC 3.5-10000-1 OT SUSP: OTIC | 0 refills | Status: AC

## 2022-03-19 NOTE — Progress Notes (Signed)
  Subjective:  Patient ID: Audrey Rowe, female    DOB: [birth year],  MRN: [id number]  Chief Complaint  Patient presents with   Nail Problem    "I think I have ingrown toenails on both of my big toes."    47 y.o. female presents with the above complaint. History confirmed with patient.  Both sides of both great toes are causing pain, does not recall any recent infection.  She also has a bunion on the right foot and she is going to be done to help with this  Objective:  Physical Exam: warm, good capillary refill, no trophic changes or ulcerative lesions, normal DP and PT pulses, and normal sensory exam. Left Foot:  Ingrown medial and lateral hallux nail Right Foot: bunion deformity noted and ingrown medial and lateral hallux nail    Assessment:   1. Ingrowing left great toenail   2. Ingrowing right great toenail      Plan:  Patient was evaluated and treated and all questions answered.  Discussed etiology and treatment options of hallux valgus deformity including operative and nonoperative treatment.  She has not had any treatment thus far and I recommended beginning with nonoperative treatment with wider shoes, she may utilize silicone pads that she can purchase over-the-counter and topical and oral anti-inflammatories over-the-counter as needed.  I discussed with her that if this is worsening and not improving and causing pain and functional impact that she may return to see me for general take x-rays and consider surgical treatment options    Ingrown Nail, bilaterally -Patient elects to proceed with minor surgery to remove ingrown toenail today. Consent reviewed and signed by patient. -Ingrown nail excised. See procedure note. -Educated on post-procedure care including soaking. Written instructions provided and reviewed. -Rx for Cortisporin sent to pharmacy. -Advised on signs and symptoms of infection developing.  We discussed that the phenol likely will create some  redness and edema and tenderness around the nailbed as long as it is localized this is to be expected.  Will return as needed if any infection signs develop  Procedure: Excision of Ingrown Toenail Location: Bilateral 1st toe  medial and lateral  nail borders. Anesthesia: Lidocaine 1% plain; 1.5 mL and Marcaine 0.5% plain; 1.5 mL, digital block. Skin Prep: Betadine. Dressing: Silvadene; telfa; dry, sterile, compression dressing. Technique: Following skin prep, the toe was exsanguinated and a tourniquet was secured at the base of the toe. The affected nail border was freed, split with a nail splitter, and excised. Chemical matrixectomy was then performed with phenol and irrigated out with alcohol. The tourniquet was then removed and sterile dressing applied. Disposition: Patient tolerated procedure well.

## 2022-03-19 NOTE — Patient Instructions (Signed)

## 2022-12-25 ENCOUNTER — Encounter: Payer: Self-pay | Admitting: Oncology

## 2022-12-26 ENCOUNTER — Encounter: Payer: Self-pay | Admitting: Cardiology

## 2022-12-26 ENCOUNTER — Encounter: Payer: Self-pay | Admitting: Oncology

## 2022-12-26 ENCOUNTER — Ambulatory Visit: Payer: Medicaid Other | Admitting: Cardiology

## 2022-12-26 VITALS — BP 118/82 | HR 97 | Ht 60.0 in | Wt 141.2 lb

## 2022-12-26 DIAGNOSIS — Z0130 Encounter for examination of blood pressure without abnormal findings: Secondary | ICD-10-CM

## 2022-12-26 DIAGNOSIS — Z1329 Encounter for screening for other suspected endocrine disorder: Secondary | ICD-10-CM

## 2022-12-26 DIAGNOSIS — R41840 Attention and concentration deficit: Secondary | ICD-10-CM | POA: Insufficient documentation

## 2022-12-26 DIAGNOSIS — R0600 Dyspnea, unspecified: Secondary | ICD-10-CM | POA: Diagnosis not present

## 2022-12-26 DIAGNOSIS — Z131 Encounter for screening for diabetes mellitus: Secondary | ICD-10-CM

## 2022-12-26 DIAGNOSIS — Z13220 Encounter for screening for lipoid disorders: Secondary | ICD-10-CM

## 2022-12-26 DIAGNOSIS — Z1211 Encounter for screening for malignant neoplasm of colon: Secondary | ICD-10-CM | POA: Diagnosis not present

## 2022-12-26 NOTE — Progress Notes (Signed)
Established Patient Office Visit  Subjective:  Patient ID: BARBRA CASUCCI, female    DOB: 03-Sep-1975  Age: 47 y.o. MRN: [id number]  Chief Complaint  Patient presents with   Follow-up    ADHD testing     Patient in office for yearly follow up. Patient did not have blood work done. Will return for fasting lab work.  Due for pap smear, last one 08/2018, negative.  Due for colon cancer screening, will refer for colonoscopy. Patient complaining of shortness of breath and racing heart with minimal exertion. Will refer to cardiology.  Patient states she has problems concentrating, wanting to start a medication to help. Will send to psychiatry for formal testing and treatment.     No other concerns at this time.   Past Medical History:  Diagnosis Date   Anemia    Anxiety    Breast CA (HCC)    Breast cancer (HCC)    Depression    GERD (gastroesophageal reflux disease)    Sleep difficulties     Past Surgical History:  Procedure Laterality Date   BILATERAL TOTAL MASTECTOMY WITH AXILLARY LYMPH NODE DISSECTION  2010   CESAREAN SECTION     CHOLECYSTECTOMY     MASTECTOMY  2010   TUBAL LIGATION      Social History   Socioeconomic History   Marital status: Married    Spouse name: Not on file   Number of children: 3   Years of education: Not on file   Highest education level: Not on file  Occupational History   Not on file  Tobacco Use   Smoking status: Former    Current packs/day: 0.00    Types: Cigarettes    Quit date: 09/09/2006    Years since quitting: 16.3   Smokeless tobacco: Never  Vaping Use   Vaping status: Never Used  Substance and Sexual Activity   Alcohol use: Yes    Comment: a glass or two of wine per week   Drug use: Not Currently    Frequency: 1.0 times per week   Sexual activity: Yes    Birth control/protection: Surgical, OCP  Other Topics Concern   Not on file  Social History Narrative   Not on file   Social Drivers of Health   Financial  Resource Strain: Not on file  Food Insecurity: Not on file  Transportation Needs: Not on file  Physical Activity: Not on file  Stress: Not on file  Social Connections: Not on file  Intimate Partner Violence: Not on file    Family History  Problem Relation Age of Onset   Prostate cancer Maternal Grandfather     Allergies  Allergen Reactions   Tramadol Other (See Comments)    Insomnia     Outpatient Medications Prior to Visit  Medication Sig   busPIRone (BUSPAR) 15 MG tablet buspirone 15 mg tablet   cyclobenzaprine (FLEXERIL) 10 MG tablet    diazepam (VALIUM) 2 MG tablet Take 1 tablet (2 mg total) by mouth every 8 (eight) hours as needed for muscle spasms.   ferrous sulfate 325 (65 FE) MG tablet Take by mouth.   FLUoxetine (PROZAC) 20 MG tablet Take 20 mg by mouth daily.   mometasone (ELOCON) 0.1 % cream Apply 1 application. topically See admin instructions. Apply topically to aas of lower legs and feet use 5 days weekly.   naproxen (NAPROSYN) 500 MG tablet Take 1 tablet (500 mg total) by mouth 2 (two) times daily with a meal.  neomycin-polymyxin-hydrocortisone (CORTISPORIN) 3.5-10000-1 OTIC suspension Apply 1-2 drops daily after soaking and cover with bandaid   traZODone (DESYREL) 50 MG tablet Take 50 mg by mouth daily.   valACYclovir (VALTREX) 500 MG tablet Take 1 tablet (500 mg total) by mouth daily. Can increase to twice a day for 5 days in the event of a recurrence   VIORELE 0.15-0.02/0.01 MG (21/5) tablet TAKE 1 TABLET BY MOUTH AT BEDTIME   cetirizine (ZYRTEC) 10 MG tablet Take 10 mg by mouth daily.   chlorthalidone (HYGROTON) 25 MG tablet TAKE 1 TABLET BY MOUTH DAILY IN THE MORNING   dicyclomine (BENTYL) 10 MG capsule Take 1 capsule (10 mg total) by mouth 3 (three) times daily for 5 days.   famotidine (PEPCID) 20 MG tablet Take 1 tablet (20 mg total) by mouth daily.   hydrOXYzine (ATARAX/VISTARIL) 25 MG tablet hydroxyzine HCl 25 mg tablet   medroxyPROGESTERone  (DEPO-PROVERA) 150 MG/ML injection Inject 1 mL (150 mg total) into the muscle every 3 (three) months. (Patient not taking: Reported on 12/26/2022)   meloxicam (MOBIC) 15 MG tablet meloxicam 15 mg tablet  Take 1 tablet every day by oral route. (Patient not taking: Reported on 12/26/2022)   ondansetron (ZOFRAN-ODT) 4 MG disintegrating tablet Take 1 tablet (4 mg total) by mouth every 8 (eight) hours as needed for nausea or vomiting. (Patient not taking: Reported on 12/26/2022)   oxyCODONE-acetaminophen (PERCOCET/ROXICET) 5-325 MG tablet Take 1 tablet by mouth every 4 (four) hours as needed for severe pain. (Patient not taking: Reported on 12/26/2022)   potassium chloride (KLOR-CON M) 10 MEQ tablet Take 4 tablets (40 mEq total) by mouth daily. (Patient not taking: Reported on 12/26/2022)   prazosin (MINIPRESS) 1 MG capsule prazosin 1 mg capsule (Patient not taking: Reported on 12/26/2022)   [DISCONTINUED] celecoxib (CELEBREX) 100 MG capsule Take 1 capsule (100 mg total) by mouth 2 (two) times daily. (Patient not taking: Reported on 12/26/2022)   [DISCONTINUED] hydrochlorothiazide (HYDRODIURIL) 25 MG tablet Take by mouth. (Patient not taking: Reported on 12/26/2022)   No facility-administered medications prior to visit.    Review of Systems  Constitutional: Negative.   HENT: Negative.    Eyes: Negative.   Respiratory:  Positive for shortness of breath.   Cardiovascular:  Positive for palpitations. Negative for chest pain.  Gastrointestinal: Negative.  Negative for abdominal pain, constipation and diarrhea.  Genitourinary: Negative.   Musculoskeletal:  Negative for joint pain and myalgias.  Skin: Negative.   Neurological: Negative.  Negative for dizziness and headaches.  Endo/Heme/Allergies: Negative.   All other systems reviewed and are negative.      Objective:   BP 118/82 (BP Location: Left Arm, Patient Position: Sitting, Cuff Size: Small)   Pulse 97   Ht 5' (1.524 m)   Wt 141 lb 3.2  oz (64 kg)   SpO2 98%   BMI 27.58 kg/m   Vitals:   12/26/22 0926 12/26/22 0946  BP: 134/82 118/82  Pulse: 97   Height: 5' (1.524 m)   Weight: 141 lb 3.2 oz (64 kg)   SpO2: 98%   BMI (Calculated): 27.58     Physical Exam Vitals and nursing note reviewed.  Constitutional:      Appearance: Normal appearance. She is normal weight.  HENT:     Head: Normocephalic and atraumatic.     Nose: Nose normal.     Mouth/Throat:     Mouth: Mucous membranes are moist.  Eyes:     Extraocular Movements: Extraocular movements intact.  Conjunctiva/sclera: Conjunctivae normal.     Pupils: Pupils are equal, round, and reactive to light.  Cardiovascular:     Rate and Rhythm: Normal rate and regular rhythm.     Pulses: Normal pulses.     Heart sounds: Normal heart sounds.  Pulmonary:     Effort: Pulmonary effort is normal.     Breath sounds: Normal breath sounds.  Abdominal:     General: Abdomen is flat. Bowel sounds are normal.     Palpations: Abdomen is soft.  Musculoskeletal:        General: Normal range of motion.     Cervical back: Normal range of motion.  Skin:    General: Skin is warm and dry.  Neurological:     General: No focal deficit present.     Mental Status: She is alert and oriented to person, place, and time.  Psychiatric:        Mood and Affect: Mood normal.        Behavior: Behavior normal.        Thought Content: Thought content normal.        Judgment: Judgment normal.      No results found for any visits on 12/26/22.  No results found for this or any previous visit (from the past 2160 hours).    Assessment & Plan:  Schedule pap smear Colon cancer screening referral sent Cardiology referral sent Psychiatry referral sent Return for fasting lab work  Problem List Items Addressed This Visit       Other   Colon cancer screening - Primary   Relevant Orders   Ambulatory referral to Gastroenterology   Dyspnea   Relevant Orders   Ambulatory referral  to Cardiology   Attention deficit   Relevant Orders   Ambulatory referral to Psychiatry   Other Visit Diagnoses       Diabetes mellitus screening       Relevant Orders   CMP14+EGFR   Hemoglobin A1c     Thyroid disorder screening       Relevant Orders   TSH     Lipid screening       Relevant Orders   Lipid Profile       Return in about 4 weeks (around 01/23/2023) for pap with Marchelle Folks or NK.   Total time spent: 25 minutes  [employer], NP  12/26/2022   This document may have been prepared by Dragon Voice Recognition software and as such may include unintentional dictation errors.

## 2022-12-27 ENCOUNTER — Telehealth: Payer: Self-pay

## 2022-12-27 ENCOUNTER — Other Ambulatory Visit: Payer: Self-pay

## 2022-12-27 DIAGNOSIS — Z1211 Encounter for screening for malignant neoplasm of colon: Secondary | ICD-10-CM

## 2022-12-27 MED ORDER — NA SULFATE-K SULFATE-MG SULF 17.5-3.13-1.6 GM/177ML PO SOLN: 1.0000 | Freq: Once | ORAL | 0 refills | Status: AC

## 2022-12-27 NOTE — Telephone Encounter (Signed)
Gastroenterology Pre-Procedure Review  Request Date: 01/30/23 Requesting Physician: Dr. Allegra Lai  PATIENT REVIEW QUESTIONS: The patient responded to the following health history questions as indicated:    1. Are you having any GI issues? no 2. Do you have a personal history of Polyps? no 3. Do you have a family history of Colon Cancer or Polyps? no 4. Diabetes Mellitus? no 5. Joint replacements in the past 12 months?no 6. Major health problems in the past 3 months?no 7. Any artificial heart valves, MVP, or defibrillator?no    MEDICATIONS & ALLERGIES:    Patient reports the following regarding taking any anticoagulation/antiplatelet therapy:   Plavix, Coumadin, Eliquis, Xarelto, Lovenox, Pradaxa, Brilinta, or Effient? no Aspirin? no  Patient confirms/reports the following medications:  Current Outpatient Medications  Medication Sig Dispense Refill   Na Sulfate-K Sulfate-Mg Sulf 17.5-3.13-1.6 GM/177ML SOLN Take 1 kit by mouth once for 1 dose. 354 mL 0   busPIRone (BUSPAR) 15 MG tablet buspirone 15 mg tablet     cetirizine (ZYRTEC) 10 MG tablet Take 10 mg by mouth daily.     chlorthalidone (HYGROTON) 25 MG tablet TAKE 1 TABLET BY MOUTH DAILY IN THE MORNING     cyclobenzaprine (FLEXERIL) 10 MG tablet      diazepam (VALIUM) 2 MG tablet Take 1 tablet (2 mg total) by mouth every 8 (eight) hours as needed for muscle spasms. 15 tablet 0   dicyclomine (BENTYL) 10 MG capsule Take 1 capsule (10 mg total) by mouth 3 (three) times daily for 5 days. 15 capsule 0   famotidine (PEPCID) 20 MG tablet Take 1 tablet (20 mg total) by mouth daily. 30 tablet 1   ferrous sulfate 325 (65 FE) MG tablet Take by mouth.     FLUoxetine (PROZAC) 20 MG tablet Take 20 mg by mouth daily.     hydrOXYzine (ATARAX/VISTARIL) 25 MG tablet hydroxyzine HCl 25 mg tablet     medroxyPROGESTERone (DEPO-PROVERA) 150 MG/ML injection Inject 1 mL (150 mg total) into the muscle every 3 (three) months. (Patient not taking: Reported on  12/26/2022) 1 mL 1   meloxicam (MOBIC) 15 MG tablet meloxicam 15 mg tablet  Take 1 tablet every day by oral route. (Patient not taking: Reported on 12/26/2022)     mometasone (ELOCON) 0.1 % cream Apply 1 application. topically See admin instructions. Apply topically to aas of lower legs and feet use 5 days weekly. 45 g 1   naproxen (NAPROSYN) 500 MG tablet Take 1 tablet (500 mg total) by mouth 2 (two) times daily with a meal. 14 tablet 0   neomycin-polymyxin-hydrocortisone (CORTISPORIN) 3.5-10000-1 OTIC suspension Apply 1-2 drops daily after soaking and cover with bandaid 10 mL 0   ondansetron (ZOFRAN-ODT) 4 MG disintegrating tablet Take 1 tablet (4 mg total) by mouth every 8 (eight) hours as needed for nausea or vomiting. (Patient not taking: Reported on 12/26/2022) 12 tablet 0   oxyCODONE-acetaminophen (PERCOCET/ROXICET) 5-325 MG tablet Take 1 tablet by mouth every 4 (four) hours as needed for severe pain. (Patient not taking: Reported on 12/26/2022) 15 tablet 0   potassium chloride (KLOR-CON M) 10 MEQ tablet Take 4 tablets (40 mEq total) by mouth daily. (Patient not taking: Reported on 12/26/2022) 12 tablet 0   prazosin (MINIPRESS) 1 MG capsule prazosin 1 mg capsule (Patient not taking: Reported on 12/26/2022)     traZODone (DESYREL) 50 MG tablet Take 50 mg by mouth daily.     valACYclovir (VALTREX) 500 MG tablet Take 1 tablet (500 mg total) by  mouth daily. Can increase to twice a day for 5 days in the event of a recurrence 30 tablet 12   VIORELE 0.15-0.02/0.01 MG (21/5) tablet TAKE 1 TABLET BY MOUTH AT BEDTIME 28 tablet 2   No current facility-administered medications for this visit.    Patient confirms/reports the following allergies:  Allergies  Allergen Reactions   Tramadol Other (See Comments)    Insomnia     No orders of the defined types were placed in this encounter.   AUTHORIZATION INFORMATION Primary Insurance: 1D#: Group #:  Secondary Insurance: 1D#: Group  #:  SCHEDULE INFORMATION: Date: 01/30/23 Time: Location: [facility]

## 2023-01-01 ENCOUNTER — Encounter: Payer: Self-pay | Admitting: Oncology

## 2023-01-10 ENCOUNTER — Institutional Professional Consult (permissible substitution): Payer: Medicaid Other | Admitting: Cardiology

## 2023-01-11 ENCOUNTER — Encounter: Payer: Self-pay | Admitting: Oncology

## 2023-01-24 ENCOUNTER — Encounter: Payer: Medicaid Other | Admitting: Family

## 2023-01-30 ENCOUNTER — Encounter: Admission: RE | Payer: Self-pay | Source: Home / Self Care

## 2023-01-30 ENCOUNTER — Ambulatory Visit: Admission: RE | Admit: 2023-01-30 | Payer: Medicaid Other | Source: Home / Self Care | Admitting: Gastroenterology

## 2023-01-30 ENCOUNTER — Telehealth: Payer: Self-pay

## 2023-01-30 SURGERY — COLONOSCOPY WITH PROPOFOL
Anesthesia: General

## 2023-01-30 NOTE — Telephone Encounter (Signed)
Received staff message from Roxie in endo on 01/29/20 stated that patient forgot about her colonoscopy scheduled with Dr. Allegra Lai.  She had eaten, and did not pick up her prescription.  Attempted to call patient this morning to reschedule her but her voice mailbox was full.  I've informed Verlon Au that she can [place] and if the patient calls me back to schedule I will put in a new order.  Thanks,  [place], [place]
